# Patient Record
Sex: Female | Born: 1985 | Race: Black or African American | Hispanic: No | Marital: Single | Smoking: Former smoker
Health system: Southern US, Community
[De-identification: ages and names within clinical notes are randomized; demographics above are authoritative.]

## PROBLEM LIST (undated history)

## (undated) ENCOUNTER — Inpatient Hospital Stay (HOSPITAL_COMMUNITY): Payer: Self-pay

## (undated) DIAGNOSIS — Z9889 Other specified postprocedural states: Secondary | ICD-10-CM

## (undated) DIAGNOSIS — D649 Anemia, unspecified: Secondary | ICD-10-CM

## (undated) DIAGNOSIS — K469 Unspecified abdominal hernia without obstruction or gangrene: Secondary | ICD-10-CM

## (undated) HISTORY — PX: BREAST LUMPECTOMY: SHX2

## (undated) HISTORY — DX: Other specified postprocedural states: Z98.890

## (undated) HISTORY — DX: Unspecified abdominal hernia without obstruction or gangrene: K46.9

## (undated) HISTORY — PX: HERNIA REPAIR: SHX51

---

## 2004-01-27 ENCOUNTER — Encounter: Admission: RE | Admit: 2004-01-27 | Discharge: 2004-01-27 | Payer: Self-pay | Admitting: Obstetrics & Gynecology

## 2005-04-21 ENCOUNTER — Ambulatory Visit (HOSPITAL_COMMUNITY): Admission: RE | Admit: 2005-04-21 | Discharge: 2005-04-21 | Payer: Self-pay | Admitting: Urology

## 2005-12-21 ENCOUNTER — Emergency Department (HOSPITAL_COMMUNITY): Admission: EM | Admit: 2005-12-21 | Discharge: 2005-12-22 | Payer: Self-pay | Admitting: Emergency Medicine

## 2006-07-05 ENCOUNTER — Emergency Department (HOSPITAL_COMMUNITY): Admission: EM | Admit: 2006-07-05 | Discharge: 2006-07-06 | Payer: Self-pay | Admitting: Emergency Medicine

## 2007-04-11 ENCOUNTER — Emergency Department (HOSPITAL_COMMUNITY): Admission: EM | Admit: 2007-04-11 | Discharge: 2007-04-11 | Payer: Self-pay | Admitting: Emergency Medicine

## 2008-03-20 ENCOUNTER — Emergency Department (HOSPITAL_COMMUNITY): Admission: EM | Admit: 2008-03-20 | Discharge: 2008-03-20 | Payer: Self-pay | Admitting: Emergency Medicine

## 2008-07-29 ENCOUNTER — Emergency Department (HOSPITAL_COMMUNITY): Admission: EM | Admit: 2008-07-29 | Discharge: 2008-07-29 | Payer: Self-pay | Admitting: Emergency Medicine

## 2013-04-02 ENCOUNTER — Encounter (HOSPITAL_COMMUNITY): Payer: Self-pay | Admitting: Physical Medicine and Rehabilitation

## 2013-04-02 ENCOUNTER — Emergency Department (HOSPITAL_COMMUNITY)
Admission: EM | Admit: 2013-04-02 | Discharge: 2013-04-02 | Disposition: A | Discharge status: Home / Self Care | Payer: 59 | Attending: Emergency Medicine | Admitting: Emergency Medicine

## 2013-04-02 DIAGNOSIS — K047 Periapical abscess without sinus: Secondary | ICD-10-CM

## 2013-04-02 DIAGNOSIS — K089 Disorder of teeth and supporting structures, unspecified: Secondary | ICD-10-CM | POA: Insufficient documentation

## 2013-04-02 DIAGNOSIS — K044 Acute apical periodontitis of pulpal origin: Secondary | ICD-10-CM | POA: Insufficient documentation

## 2013-04-02 DIAGNOSIS — K0889 Other specified disorders of teeth and supporting structures: Secondary | ICD-10-CM

## 2013-04-02 DIAGNOSIS — K029 Dental caries, unspecified: Secondary | ICD-10-CM | POA: Insufficient documentation

## 2013-04-02 DIAGNOSIS — F17200 Nicotine dependence, unspecified, uncomplicated: Secondary | ICD-10-CM | POA: Insufficient documentation

## 2013-04-02 DIAGNOSIS — R110 Nausea: Secondary | ICD-10-CM | POA: Insufficient documentation

## 2013-04-02 DIAGNOSIS — R599 Enlarged lymph nodes, unspecified: Secondary | ICD-10-CM | POA: Insufficient documentation

## 2013-04-02 MED ORDER — CLINDAMYCIN HCL 150 MG PO CAPS: 450.0000 mg | ORAL_CAPSULE | Freq: Three times a day (TID) | ORAL | Status: DC

## 2013-04-02 MED ORDER — HYDROCODONE-ACETAMINOPHEN 5-325 MG PO TABS
2.0000 | ORAL_TABLET | Freq: Once | ORAL | Status: AC
  Administered 2013-04-02: 2 via ORAL
  Filled 2013-04-02: qty 2

## 2013-04-02 MED ORDER — HYDROCODONE-ACETAMINOPHEN 5-325 MG PO TABS: 1.0000 | ORAL_TABLET | ORAL | Status: DC | PRN

## 2013-04-02 NOTE — ED Provider Notes (Signed)
  Medical screening examination/treatment/procedure(s) were performed by non-physician practitioner and as supervising physician I was immediately available for consultation/collaboration.  On my exam the patient was in no distress. She did have TTP about the lower R jaw.  There is no E/O airway compromise, nor systemic distress.    Gerhard Munch, MD 04/02/13 2358

## 2013-04-02 NOTE — ED Notes (Signed)
Pt presents to department for evaluation of R lower molar pain and facial swelling. Ongoing for several days. 10/10 pain at the time. Pt is alert and oriented x4.

## 2013-04-02 NOTE — ED Provider Notes (Signed)
History    This chart was scribed for non-physician practitioner, Dierdre Forth PA-C working with Gerhard Munch, MD by Donne Anon, ED Scribe. This patient was seen in room TR10C/TR10C and the patient's care was started at 1506.   CSN: [id number]  Arrival date & time 04/02/13  1332   First MD Initiated Contact with Patient 04/02/13 1506      Chief Complaint  Patient presents with  . Dental Pain     The history is provided by the patient and medical records. No language interpreter was used.   HPI Comments: Deborah Sawyer is a 27 y.o. female who presents to the Emergency Department complaining of 1.5 days of gradual onset, gradually worsening, waxing and waning, severe right lower dental pain that radiates to her jaw and neck associated facial swelling that began last night, nausea and lightheadedness. She states she had a filling on the lower right side of her mouth 6 years ago, and has been unable to chew hard or sticky foods on that side of her mouth since. She states she chewed gum on the right side of her mouth 2 days ago, which may have prompted the most recent episode. She reports that she has has similar toothaches in the past, but they typically resolve on their own. She has tried ibuprofen and Tylenol with little relief. She denies fever, vomiting, diarrhea, ear pain, or any other pain. She reports she is otherwise healthy.   Dr. Tristan Schroeder is her dentist.   No past medical history on file.  No past surgical history on file.  No family history on file.  History  Substance Use Topics  . Smoking status: Current Every Day Smoker    Types: Cigarettes  . Smokeless tobacco: Not on file  . Alcohol Use: Yes     Review of Systems  Constitutional: Negative for fever, diaphoresis, appetite change, fatigue and unexpected weight change.  HENT: Positive for dental problem. Negative for mouth sores and neck stiffness.   Eyes: Negative for visual disturbance.  Respiratory:  Negative for cough, chest tightness, shortness of breath and wheezing.   Cardiovascular: Negative for chest pain.  Gastrointestinal: Positive for nausea. Negative for vomiting, abdominal pain, diarrhea and constipation.  Endocrine: Negative for polydipsia, polyphagia and polyuria.  Genitourinary: Negative for dysuria, urgency, frequency and hematuria.  Musculoskeletal: Negative for back pain.  Skin: Negative for rash.  Allergic/Immunologic: Negative for immunocompromised state.  Neurological: Negative for syncope, light-headedness and headaches.  Hematological: Does not bruise/bleed easily.  Psychiatric/Behavioral: Negative for sleep disturbance. The patient is not nervous/anxious.   All other systems reviewed and are negative.    Allergies  Review of patient's allergies indicates no known allergies.  Home Medications   Current Outpatient Rx  Name  Route  Sig  Dispense  Refill  . acetaminophen (TYLENOL) 500 MG tablet   Oral   Take 1,000 mg by mouth once as needed for pain.         Marland Kitchen ibuprofen (ADVIL,MOTRIN) 200 MG tablet   Oral   Take 400 mg by mouth once as needed for pain.         . clindamycin (CLEOCIN) 150 MG capsule   Oral   Take 3 capsules (450 mg total) by mouth 3 (three) times daily.   90 capsule   0   . HYDROcodone-acetaminophen (NORCO/VICODIN) 5-325 MG per tablet   Oral   Take 1-2 tablets by mouth every 4 (four) hours as needed for pain.   21 tablet  0     BP 138/88  Pulse 72  Temp(Src) 98.8 F (37.1 C) (Oral)  Resp 18  SpO2 100%  Physical Exam  Nursing note and vitals reviewed. Constitutional: She appears well-developed and well-nourished.  HENT:  Head: Normocephalic.    Right Ear: Tympanic membrane, external ear and ear canal normal.  Left Ear: Tympanic membrane, external ear and ear canal normal.  Nose: Nose normal. Right sinus exhibits no maxillary sinus tenderness and no frontal sinus tenderness. Left sinus exhibits no maxillary sinus  tenderness and no frontal sinus tenderness.  Mouth/Throat: Uvula is midline, oropharynx is clear and moist and mucous membranes are normal. No oral lesions. Abnormal dentition. Dental caries present. No edematous or lacerations. No oropharyngeal exudate, posterior oropharyngeal edema, posterior oropharyngeal erythema or tonsillar abscesses.  Visible swelling of the right side of her jaw. All teeth on the right lower are filled. Tooth 29 with a crack in it. No palpable abscess. No area of fluctuance. Mild induration of the tissue of the right cheek but no visible or palpable apical abscess of any of the teeth. Submandibular lymphadenopathy. No cervical lymphadenopathy.  Eyes: Conjunctivae are normal. Pupils are equal, round, and reactive to light. Right eye exhibits no discharge. Left eye exhibits no discharge.  Neck: Normal range of motion. Neck supple.  Cardiovascular: Normal rate, regular rhythm, normal heart sounds and intact distal pulses.   No murmur heard. Pulmonary/Chest: Effort normal and breath sounds normal. No respiratory distress. She has no wheezes.  Abdominal: Soft. Bowel sounds are normal. She exhibits no distension. There is no tenderness.  Lymphadenopathy:       Head (right side): Submandibular and tonsillar adenopathy present. No submental, no preauricular, no posterior auricular and no occipital adenopathy present.       Head (left side): No submental, no submandibular, no tonsillar, no preauricular, no posterior auricular and no occipital adenopathy present.    She has no cervical adenopathy.       Right cervical: No superficial cervical, no deep cervical and no posterior cervical adenopathy present.      Left cervical: No superficial cervical, no deep cervical and no posterior cervical adenopathy present.  Neurological: She is alert.  Skin: Skin is warm and dry.  Psychiatric: She has a normal mood and affect.    ED Course  Procedures (including critical care  time) DIAGNOSTIC STUDIES: Oxygen Saturation is 100% on RA, normal by my interpretation.    COORDINATION OF CARE: 3:21 PM Discussed treatment plan which includes consulting with Dr. Jeraldine Loots with pt at bedside and pt agreed to plan.   3:31 PM Case discussed with Dr. Jeraldine Loots. He evaluated the pt and recommended antibiotics, pain medication and dental referral.    Labs Reviewed - No data to display No results found.   1. Pain, dental   2. Dental infection       MDM  Leda Gauze presents with dental pain and concern for infection on PE.  Patient with toothache.  No gross abscess amenable to I&D.  Exam unconcerning for Ludwig's angina or spread of infection.  Will treat with penicillin and pain medicine.  Urged patient to follow-up with dentist.  I have also discussed reasons to return immediately to the ER.  Patient expresses understanding and agrees with plan.    I personally performed the services described in this documentation, which was scribed in my presence. The recorded information has been reviewed and is accurate.    Dahlia Client Lovina Zuver, PA-C 04/02/13 1545

## 2017-07-18 ENCOUNTER — Ambulatory Visit (INDEPENDENT_AMBULATORY_CARE_PROVIDER_SITE_OTHER): Payer: Medicaid Other | Admitting: General Practice

## 2017-07-18 ENCOUNTER — Encounter: Payer: Self-pay | Admitting: General Practice

## 2017-07-18 DIAGNOSIS — Z3201 Encounter for pregnancy test, result positive: Secondary | ICD-10-CM

## 2017-07-18 LAB — POCT PREGNANCY, URINE: PREG TEST UR: POSITIVE — AB

## 2017-07-18 NOTE — Progress Notes (Signed)
Agree with nursing staff's documentation of this patient's clinic encounter.  Madelaine Whipple, PA-C    

## 2017-07-18 NOTE — Progress Notes (Signed)
Patient here for UPT today. UPT +. Patient reports first positive home test 07/10/17. LMP 05/26/17 EDD 03/02/18 [redacted]w[redacted]d. Patient denies taking any medications, only PNV. Encouraged patient to start prenatal care around 12 weeks and pregnancy verification letter given. Patient had no questions

## 2017-08-16 ENCOUNTER — Ambulatory Visit (INDEPENDENT_AMBULATORY_CARE_PROVIDER_SITE_OTHER): Payer: Medicaid Other | Admitting: Student

## 2017-08-16 ENCOUNTER — Encounter: Payer: Self-pay | Admitting: Student

## 2017-08-16 ENCOUNTER — Other Ambulatory Visit (HOSPITAL_COMMUNITY)
Admission: RE | Admit: 2017-08-16 | Discharge: 2017-08-16 | Disposition: A | Discharge status: Home / Self Care | Payer: Medicaid Other | Source: Ambulatory Visit | Attending: Student | Admitting: Student

## 2017-08-16 VITALS — BP 113/62 | HR 76 | Ht 62.0 in | Wt 154.1 lb

## 2017-08-16 DIAGNOSIS — Z1389 Encounter for screening for other disorder: Secondary | ICD-10-CM | POA: Diagnosis not present

## 2017-08-16 DIAGNOSIS — Z3480 Encounter for supervision of other normal pregnancy, unspecified trimester: Secondary | ICD-10-CM | POA: Diagnosis not present

## 2017-08-16 DIAGNOSIS — Z113 Encounter for screening for infections with a predominantly sexual mode of transmission: Secondary | ICD-10-CM

## 2017-08-16 DIAGNOSIS — Z331 Pregnant state, incidental: Secondary | ICD-10-CM

## 2017-08-16 DIAGNOSIS — Z3481 Encounter for supervision of other normal pregnancy, first trimester: Secondary | ICD-10-CM | POA: Diagnosis not present

## 2017-08-16 LAB — POCT URINALYSIS DIP (DEVICE)
BILIRUBIN URINE: NEGATIVE
GLUCOSE, UA: NEGATIVE mg/dL
Hgb urine dipstick: NEGATIVE
KETONES UR: NEGATIVE mg/dL
Nitrite: NEGATIVE
PROTEIN: NEGATIVE mg/dL
SPECIFIC GRAVITY, URINE: 1.01 (ref 1.005–1.030)
Urobilinogen, UA: 0.2 mg/dL (ref 0.0–1.0)
pH: 5.5 (ref 5.0–8.0)

## 2017-08-16 NOTE — Patient Instructions (Signed)

## 2017-08-16 NOTE — Progress Notes (Signed)
  Subjective:    Deborah Sawyer is being seen today for her first obstetrical visit.  This is a planned pregnancy. She is at 55106w5d gestation. Her obstetrical history is significant for nothing. . Relationship with FOB: significant other, living together. Patient does intend to breast feed. Pregnancy history fully reviewed.  Patient reports no complaints.  Review of Systems:   Review of Systems  Constitutional: Negative.   HENT: Negative.   Respiratory: Negative.   Cardiovascular: Negative.   Gastrointestinal: Negative.   Genitourinary: Negative.   Musculoskeletal: Negative.   Neurological: Negative.     Objective:     BP 113/62   Pulse 76   Ht 5\' 2"  (1.575 m)   Wt 154 lb 1.6 oz (69.9 kg)   LMP 05/26/2017 (Exact Date)   BMI 28.19 kg/m  Physical Exam  Constitutional: She is oriented to person, place, and time. She appears well-developed and well-nourished.  HENT:  Head: Normocephalic.  Eyes: Pupils are equal, round, and reactive to light.  Neck: Normal range of motion.  Respiratory: Effort normal and breath sounds normal.  GI: Soft. Bowel sounds are normal.  Genitourinary:  Genitourinary Comments: NEFG; white mucousy discharge in the vagina, cervix is pink and non-tender. No suprapubic or adnexal tenderness.   Musculoskeletal: Normal range of motion.  Neurological: She is alert and oriented to person, place, and time.  Skin: Skin is warm and dry.    Exam    Assessment:    Pregnancy: G2P1001 Patient Active Problem List   Diagnosis Date Noted  . Supervision of other normal pregnancy, antepartum 08/16/2017       Plan:     Initial labs drawn. Prenatal vitamins. Problem list reviewed and updated. AFP3 discussed: declined. Role of ultrasound in pregnancy discussed; fetal survey: order at next visit. . Amniocentesis discussed: not indicated. Follow up in 4 weeks. 50% of 30 min visit spent on counseling and coordination of care.    Reviewed how our practice  works; all questions answered.   Will enroll in [employer].  Charlesetta GaribaldiKathryn Lorraine Kooistra CNM 08/16/2017

## 2017-08-17 LAB — CYTOLOGY - PAP
Chlamydia: NEGATIVE
Diagnosis: NEGATIVE
HPV: NOT DETECTED
NEISSERIA GONORRHEA: NEGATIVE

## 2017-08-18 LAB — CULTURE, OB URINE

## 2017-08-18 LAB — URINE CULTURE, OB REFLEX

## 2017-08-20 ENCOUNTER — Other Ambulatory Visit: Payer: Self-pay | Admitting: Student

## 2017-08-20 DIAGNOSIS — A599 Trichomoniasis, unspecified: Secondary | ICD-10-CM | POA: Insufficient documentation

## 2017-08-20 LAB — HEMOGLOBINOPATHY EVALUATION
HGB C: 0 %
HGB S: 0 %
HGB VARIANT: 0 %
Hemoglobin A2 Quantitation: 2.3 % (ref 1.8–3.2)
Hemoglobin F Quantitation: 0 % (ref 0.0–2.0)
Hgb A: 97.7 % (ref 96.4–98.8)

## 2017-08-20 LAB — OBSTETRIC PANEL, INCLUDING HIV
Antibody Screen: NEGATIVE
BASOS ABS: 0 10*3/uL (ref 0.0–0.2)
Basos: 0 %
EOS (ABSOLUTE): 0.3 10*3/uL (ref 0.0–0.4)
Eos: 3 %
HEP B S AG: NEGATIVE
HIV Screen 4th Generation wRfx: NONREACTIVE
Hematocrit: 34.2 % (ref 34.0–46.6)
Hemoglobin: 11 g/dL — ABNORMAL LOW (ref 11.1–15.9)
IMMATURE GRANS (ABS): 0 10*3/uL (ref 0.0–0.1)
IMMATURE GRANULOCYTES: 0 %
LYMPHS: 20 %
Lymphocytes Absolute: 2.3 10*3/uL (ref 0.7–3.1)
MCH: 28.9 pg (ref 26.6–33.0)
MCHC: 32.2 g/dL (ref 31.5–35.7)
MCV: 90 fL (ref 79–97)
MONOCYTES: 6 %
Monocytes Absolute: 0.7 10*3/uL (ref 0.1–0.9)
NEUTROS ABS: 8.1 10*3/uL — AB (ref 1.4–7.0)
NEUTROS PCT: 71 %
PLATELETS: 263 10*3/uL (ref 150–379)
RBC: 3.81 x10E6/uL (ref 3.77–5.28)
RDW: 14 % (ref 12.3–15.4)
RPR: NONREACTIVE
RUBELLA: 6.99 {index} (ref >0.99)
Rh Factor: POSITIVE
WBC: 11.4 10*3/uL — ABNORMAL HIGH (ref 3.4–10.8)

## 2017-08-20 LAB — HEMOGLOBIN A1C
ESTIMATED AVERAGE GLUCOSE: 97 mg/dL
HEMOGLOBIN A1C: 5 % (ref 4.8–5.6)

## 2017-08-20 MED ORDER — METRONIDAZOLE 500 MG PO TABS: 2000.0000 mg | ORAL_TABLET | Freq: Once | ORAL | 0 refills | Status: AC

## 2017-08-20 MED ORDER — METRONIDAZOLE 500 MG PO TABS: 2000.0000 mg | ORAL_TABLET | Freq: Once | ORAL | 0 refills | Status: DC

## 2017-08-20 MED ORDER — METRONIDAZOLE 500 MG PO TABS: 500.0000 mg | ORAL_TABLET | Freq: Two times a day (BID) | ORAL | 0 refills | Status: DC

## 2017-08-20 MED ORDER — METRONIDAZOLE 500 MG PO TABS: 2000.0000 mg | ORAL_TABLET | Freq: Two times a day (BID) | ORAL | 0 refills | Status: DC

## 2017-08-21 NOTE — Addendum Note (Signed)
Addended by: Lorelle GibbsWILSON, CHIQUITA L on: 08/21/2017 08:11 AM   Modules accepted: Orders

## 2017-08-21 NOTE — Addendum Note (Signed)
Addended by: Mikey BussingWILSON, Najae Rathert L on: 08/21/2017 07:39 AM   Modules accepted: Orders

## 2017-08-24 LAB — CYSTIC FIBROSIS MUTATION 97: GENE DIS ANAL CARRIER INTERP BLD/T-IMP: NOT DETECTED

## 2017-08-30 ENCOUNTER — Ambulatory Visit (HOSPITAL_COMMUNITY)
Admission: RE | Admit: 2017-08-30 | Discharge: 2017-08-30 | Disposition: A | Discharge status: Home / Self Care | Payer: Medicaid Other | Source: Ambulatory Visit | Attending: Student | Admitting: Student

## 2017-08-30 DIAGNOSIS — Z3480 Encounter for supervision of other normal pregnancy, unspecified trimester: Secondary | ICD-10-CM | POA: Insufficient documentation

## 2017-08-30 DIAGNOSIS — Z3A13 13 weeks gestation of pregnancy: Secondary | ICD-10-CM | POA: Diagnosis not present

## 2017-09-20 ENCOUNTER — Ambulatory Visit (INDEPENDENT_AMBULATORY_CARE_PROVIDER_SITE_OTHER): Payer: Medicaid Other | Admitting: Student

## 2017-09-20 ENCOUNTER — Other Ambulatory Visit (HOSPITAL_COMMUNITY)
Admission: RE | Admit: 2017-09-20 | Discharge: 2017-09-20 | Disposition: A | Discharge status: Home / Self Care | Payer: Medicaid Other | Source: Ambulatory Visit | Attending: Student | Admitting: Student

## 2017-09-20 VITALS — BP 121/59 | HR 81 | Wt 155.8 lb

## 2017-09-20 DIAGNOSIS — A599 Trichomoniasis, unspecified: Secondary | ICD-10-CM | POA: Insufficient documentation

## 2017-09-20 DIAGNOSIS — Z3480 Encounter for supervision of other normal pregnancy, unspecified trimester: Secondary | ICD-10-CM

## 2017-09-20 DIAGNOSIS — Z3482 Encounter for supervision of other normal pregnancy, second trimester: Secondary | ICD-10-CM

## 2017-09-20 NOTE — Patient Instructions (Signed)
Second Trimester of Pregnancy The second trimester is from week 13 through week 28, month 4 through 6. This is often the time in pregnancy that you feel your best. Often times, morning sickness has lessened or quit. You may have more energy, and you may get hungry more often. Your unborn baby (fetus) is growing rapidly. At the end of the sixth month, he or she is about 9 inches long and weighs about 1 pounds. You will likely feel the baby move (quickening) between 18 and 20 weeks of pregnancy. Follow these instructions at home:  Avoid all smoking, herbs, and alcohol. Avoid drugs not approved by your doctor.  Do not use any tobacco products, including cigarettes, chewing tobacco, and electronic cigarettes. If you need help quitting, ask your doctor. You may get counseling or other support to help you quit.  Only take medicine as told by your doctor. Some medicines are safe and some are not during pregnancy.  Exercise only as told by your doctor. Stop exercising if you start having cramps.  Eat regular, healthy meals.  Wear a good support bra if your breasts are tender.  Do not use hot tubs, steam rooms, or saunas.  Wear your seat belt when driving.  Avoid raw meat, uncooked cheese, and liter boxes and soil used by cats.  Take your prenatal vitamins.  Take 1500-2000 milligrams of calcium daily starting at the 20th week of pregnancy until you deliver your baby.  Try taking medicine that helps you poop (stool softener) as needed, and if your doctor approves. Eat more fiber by eating fresh fruit, vegetables, and whole grains. Drink enough fluids to keep your pee (urine) clear or pale yellow.  Take warm water baths (sitz baths) to soothe pain or discomfort caused by hemorrhoids. Use hemorrhoid cream if your doctor approves.  If you have puffy, bulging veins (varicose veins), wear support hose. Raise (elevate) your feet for 15 minutes, 3-4 times a day. Limit salt in your diet.  Avoid heavy  lifting, wear low heals, and sit up straight.  Rest with your legs raised if you have leg cramps or low back pain.  Visit your dentist if you have not gone during your pregnancy. Use a soft toothbrush to brush your teeth. Be gentle when you floss.  You can have sex (intercourse) unless your doctor tells you not to.  Go to your doctor visits. Get help if:  You feel dizzy.  You have mild cramps or pressure in your lower belly (abdomen).  You have a nagging pain in your belly area.  You continue to feel sick to your stomach (nauseous), throw up (vomit), or have watery poop (diarrhea).  You have bad smelling fluid coming from your vagina.  You have pain with peeing (urination). Get help right away if:  You have a fever.  You are leaking fluid from your vagina.  You have spotting or bleeding from your vagina.  You have severe belly cramping or pain.  You lose or gain weight rapidly.  You have trouble catching your breath and have chest pain.  You notice sudden or extreme puffiness (swelling) of your face, hands, ankles, feet, or legs.  You have not felt the baby move in over an hour.  You have severe headaches that do not go away with medicine.  You have vision changes. This information is not intended to replace advice given to you by your health care provider. Make sure you discuss any questions you have with your health care   provider. Document Released: 01/03/2010 Document Revised: 03/16/2016 Document Reviewed: 12/10/2012 Elsevier Interactive Patient Education  2017 Elsevier Inc.  

## 2017-09-20 NOTE — Progress Notes (Signed)
   PRENATAL VISIT NOTE  Subjective:  Deborah Sawyer is a 31 y.o. G2P1001 at 3290w5d being seen today for ongoing prenatal care.  She is currently monitored for the following issues for this low-risk pregnancy and has Supervision of other normal pregnancy, antepartum and Trichomoniasis on their problem list.  Patient reports no complaints.  Contractions: Not present. Vag. Bleeding: None.  Movement: Absent. Denies leaking of fluid.   The following portions of the patient's history were reviewed and updated as appropriate: allergies, current medications, past family history, past medical history, past social history, past surgical history and problem list. Problem list updated.  Objective:   Vitals:   09/20/17 0936  BP: (!) 121/59  Pulse: 81  Weight: 155 lb 12.8 oz (70.7 kg)    Fetal Status: Fetal Heart Rate (bpm): 146   Movement: Absent     General:  Alert, oriented and cooperative. Patient is in no acute distress.  Skin: Skin is warm and dry. No rash noted.   Cardiovascular: Normal heart rate noted  Respiratory: Normal respiratory effort, no problems with respiration noted  Abdomen: Soft, gravid, appropriate for gestational age.  Pain/Pressure: Present     Pelvic: Cervical exam deferred        Extremities: Normal range of motion.  Edema: Trace  Mental Status:  Normal mood and affect. Normal behavior. Normal judgment and thought content.   Assessment and Plan:  Pregnancy: G2P1001 at 5190w5d  1. Supervision of other normal pregnancy, antepartum Doing well; no complaints.  - AFP TETRA - US MFM OB COMP + 14 WK; Future - Cervicovaginal ancillary only  2. Trichomoniasis TOC done today - Cervicovaginal ancillary only  Preterm labor symptoms and general obstetric precautions including but not limited to vaginal bleeding, contractions, leaking of fluid and fetal movement were reviewed in detail with the patient. Please refer to After Visit Summary for other counseling recommendations.    Return in about 4 weeks (around 10/18/2017).   Marylene LandKathryn Lorraine Kooistra, CNM

## 2017-09-21 LAB — CERVICOVAGINAL ANCILLARY ONLY: Trichomonas: NEGATIVE

## 2017-09-23 LAB — AFP TETRA
DIA MOM VALUE: 1.85
DIA VALUE (EIA): 312.83 pg/mL
DSR (By Age)    1 IN: 405
DSR (SECOND TRIMESTER) 1 IN: 1198
GESTATIONAL AGE AFP: 16 wk
MATERNAL AGE AT EDD: 33.5 a
MSAFP Mom: 1.1
MSAFP: 37.7 ng/mL
MSHCG Mom: 0.76
MSHCG: 29545 m[IU]/mL
OSB RISK: 10000
TEST RESULTS AFP: NEGATIVE
UE3 MOM: 1.14
WEIGHT: 155 [lb_av]
uE3 Value: 0.9 ng/mL

## 2017-10-02 ENCOUNTER — Encounter (HOSPITAL_COMMUNITY): Payer: Self-pay | Admitting: Student

## 2017-10-09 ENCOUNTER — Ambulatory Visit (HOSPITAL_COMMUNITY)
Admission: RE | Admit: 2017-10-09 | Discharge: 2017-10-09 | Disposition: A | Discharge status: Home / Self Care | Payer: Medicaid Other | Source: Ambulatory Visit | Attending: Student | Admitting: Student

## 2017-10-09 DIAGNOSIS — Z3A19 19 weeks gestation of pregnancy: Secondary | ICD-10-CM | POA: Diagnosis not present

## 2017-10-09 DIAGNOSIS — Z3480 Encounter for supervision of other normal pregnancy, unspecified trimester: Secondary | ICD-10-CM | POA: Diagnosis present

## 2017-10-09 DIAGNOSIS — Z3482 Encounter for supervision of other normal pregnancy, second trimester: Secondary | ICD-10-CM | POA: Diagnosis not present

## 2017-10-09 DIAGNOSIS — Z3689 Encounter for other specified antenatal screening: Secondary | ICD-10-CM | POA: Diagnosis not present

## 2017-10-18 ENCOUNTER — Ambulatory Visit (INDEPENDENT_AMBULATORY_CARE_PROVIDER_SITE_OTHER): Payer: Medicaid Other | Admitting: Student

## 2017-10-18 DIAGNOSIS — A599 Trichomoniasis, unspecified: Secondary | ICD-10-CM

## 2017-10-18 DIAGNOSIS — Z3482 Encounter for supervision of other normal pregnancy, second trimester: Secondary | ICD-10-CM

## 2017-10-18 DIAGNOSIS — Z3480 Encounter for supervision of other normal pregnancy, unspecified trimester: Secondary | ICD-10-CM

## 2017-10-18 NOTE — Patient Instructions (Signed)
AREA PEDIATRIC/FAMILY PRACTICE PHYSICIANS  [facility] [address], [address] [place], [place]  [address] Phone - [phone]   Fax - [phone]  [facility] [address] [place], [place] [address] Phone - [phone]   Fax - [phone]  [facility] [address] [place], [place]  [address] Phone - [phone]   Fax - [phone]  [facility] [address] [place], [place]  [address] Phone - [phone]   Fax - [phone]  [facility] [address] [place], [place]  [address] Phone - [phone]   Fax - [phone]  [facility] [address] [place], [place]  [address] Phone - [phone]   Fax - [phone]  [facility] [address] [place], [place]  [address] Phone - [phone]   Fax - [phone]  [facility] [address] [place], [place]  [address] Phone - [phone]   Fax - [phone]  [facility] [address] [place], [place]  [address] Phone - [phone]   Fax - [phone] [facility] [address] [place], [place]  [address] Phone - [phone]   Fax - [phone]  [facility] [address] [place], [place]  [address] Phone - [phone]   Fax - [phone]  [facility] [address] [place], [place]  [address] Phone - [phone]   Fax - [phone]  [facility] [address] [place], [place]  [address] Phone - [phone]   Fax - [phone]  [facility] [address] [place], [place]  [address] Phone - [phone]  [facility] [address] [place], [place]  [address] Phone - [phone]   Fax - [phone]  [facility] [address] [place], [place]  [address] Phone - [phone]   Fax - [phone]  [facility] [address] [place], [place]  [address] Phone - [phone]   Fax - [phone]  [facility] [address] [place], [place]  [address] Phone - [phone]   Fax - [phone]   [facility] [address] [place], [place]  [address] Phone - [phone]   Fax - [phone]  [facility] [address] [place], [place]  [address] Phone - [phone]   Fax - [phone]  [facility] [address] [place], [place]  [address] Phone - [phone]   Fax - [phone]  [facility] [address] [place], [place]  [address] Phone - [phone]   Fax - [phone]  [facility] [address] , [place]  [address] Phone - [phone]   Fax - [phone] [facility] [address] [place], [place]  [address] Phone - [phone]   Fax - [phone]  [facility] [address] [place], [place]  [address] Phone - [phone]   Fax - [phone]  [facility] [address] [place], [place]  [address] Phone - [phone]   Fax - [phone]  [facility] Charlene Flemming MD [address] [place] [address] Phone [phone]  Fax [phone]   

## 2017-10-18 NOTE — Progress Notes (Signed)
   PRENATAL VISIT NOTE  Subjective:  Deborah Sawyer is a 31 y.o. G2P1001 at 760w5d being seen today for ongoing prenatal care.  She is currently monitored for the following issues for this low-risk pregnancy and has Supervision of other normal pregnancy, antepartum and Trichomoniasis on their problem list.  Patient reports no complaints.  Contractions: Not present. Vag. Bleeding: None.  Movement: Present. Denies leaking of fluid.   The following portions of the patient's history were reviewed and updated as appropriate: allergies, current medications, past family history, past medical history, past social history, past surgical history and problem list. Problem list updated.  Objective:   Vitals:   10/18/17 1535  BP: 111/73  Pulse: 80  Weight: 165 lb 1.6 oz (74.9 kg)    Fetal Status: Fetal Heart Rate (bpm): 141   Movement: Present     General:  Alert, oriented and cooperative. Patient is in no acute distress.  Skin: Skin is warm and dry. No rash noted.   Cardiovascular: Normal heart rate noted  Respiratory: Normal respiratory effort, no problems with respiration noted  Abdomen: Soft, gravid, appropriate for gestational age.  Pain/Pressure: Present     Pelvic: Cervical exam deferred        Extremities: Normal range of motion.  Edema: Trace  Mental Status:  Normal mood and affect. Normal behavior. Normal judgment and thought content.   Assessment and Plan:  Pregnancy: G2P1001 at 7060w5d  1. Trichomoniasis Denies signs and symptoms. TOC was negative in November,.   2. Supervision of other normal pregnancy, antepartum Doing well return in 4 weeks.  Peds list given  Preterm labor symptoms and general obstetric precautions including but not limited to vaginal bleeding, contractions, leaking of fluid and fetal movement were reviewed in detail with the patient. Please refer to After Visit Summary for other counseling recommendations.  Return in about 4 weeks (around 11/15/2017), or  ROB.   Marylene LandKathryn Lorraine Kooistra, CNM

## 2017-10-23 NOTE — L&D Delivery Note (Signed)
Patient: Deborah Sawyer MRN: [id number]  GBS status: negative  Patient is a 32 y.o. now G2P2002 s/p NSVD at [redacted]w[redacted]d, who was admitted for PROM 15h 45m prior to delivery and latent labor. Labor augmented with IV Pitocin, and progressed well. Pushed for 4 minutes.  Delivery Note At 7:18 PM a viable female was delivered via Vaginal, Spontaneous (Presentation: vertex; LOA).  APGAR: 9, 9; weight pending  Placenta status: intact, sent to L&D.  Cord: 3-vessel  Anesthesia:  None; local lidocaine for perineal repair Episiotomy: None Lacerations: 1st degree perineal (repaired), left periurethral (hemostatic) Suture Repair: 3.0 Monocryl  Est. Blood Loss (mL): 200  Mom to postpartum.  Baby to Couplet care / Skin to Skin.  Raynelle Fanning P. [place], MD OB Fellow 03/05/18, 7:51 PM

## 2017-11-15 ENCOUNTER — Ambulatory Visit (INDEPENDENT_AMBULATORY_CARE_PROVIDER_SITE_OTHER): Payer: Medicaid Other | Admitting: Student

## 2017-11-15 VITALS — BP 111/53 | HR 89 | Wt 169.6 lb

## 2017-11-15 DIAGNOSIS — Z3482 Encounter for supervision of other normal pregnancy, second trimester: Secondary | ICD-10-CM

## 2017-11-15 DIAGNOSIS — Z3480 Encounter for supervision of other normal pregnancy, unspecified trimester: Secondary | ICD-10-CM

## 2017-11-15 NOTE — Patient Instructions (Signed)

## 2017-11-15 NOTE — Progress Notes (Signed)
   PRENATAL VISIT NOTE  Subjective:  Deborah Sawyer is a 32 y.o. G2P1001 at 4353w5d being seen today for ongoing prenatal care.  She is currently monitored for the following issues for this low-risk pregnancy and has Supervision of other normal pregnancy, antepartum and Trichomoniasis on their problem list.  Patient reports no complaints.  Contractions: Not present. Vag. Bleeding: None.  Movement: Present. Denies leaking of fluid.   The following portions of the patient's history were reviewed and updated as appropriate: allergies, current medications, past family history, past medical history, past social history, past surgical history and problem list. Problem list updated.  Objective:   Vitals:   11/15/17 1423  BP: (!) 111/53  Pulse: 89  Weight: 169 lb 9.6 oz (76.9 kg)    Fetal Status: Fetal Heart Rate (bpm): 155 Fundal Height: 25 cm Movement: Present     General:  Alert, oriented and cooperative. Patient is in no acute distress.  Skin: Skin is warm and dry. No rash noted.   Cardiovascular: Normal heart rate noted  Respiratory: Normal respiratory effort, no problems with respiration noted  Abdomen: Soft, gravid, appropriate for gestational age.  Pain/Pressure: Present     Pelvic: Cervical exam deferred        Extremities: Normal range of motion.  Edema: Trace  Mental Status:  Normal mood and affect. Normal behavior. Normal judgment and thought content.   Assessment and Plan:  Pregnancy: G2P1001 at 6253w5d  1. Supervision of other normal pregnancy, antepartum Doing well; recommended compression stockings for edema.  -Plan for GTT next time.   Preterm labor symptoms and general obstetric precautions including but not limited to vaginal bleeding, contractions, leaking of fluid and fetal movement were reviewed in detail with the patient. Please refer to After Visit Summary for other counseling recommendations.  Return in about 4 weeks (around 12/13/2017), or LROB and 2 hour  gtt.   Marylene LandKathryn Lorraine Kooistra, CNM

## 2017-11-26 ENCOUNTER — Emergency Department (HOSPITAL_COMMUNITY)
Admission: EM | Admit: 2017-11-26 | Discharge: 2017-11-26 | Disposition: A | Discharge status: Home / Self Care | Payer: Medicaid Other | Attending: Emergency Medicine | Admitting: Emergency Medicine

## 2017-11-26 ENCOUNTER — Emergency Department (HOSPITAL_COMMUNITY): Payer: Medicaid Other

## 2017-11-26 ENCOUNTER — Encounter (HOSPITAL_COMMUNITY): Payer: Self-pay

## 2017-11-26 ENCOUNTER — Other Ambulatory Visit: Payer: Self-pay

## 2017-11-26 DIAGNOSIS — O9989 Other specified diseases and conditions complicating pregnancy, childbirth and the puerperium: Secondary | ICD-10-CM | POA: Diagnosis present

## 2017-11-26 DIAGNOSIS — Z3A25 25 weeks gestation of pregnancy: Secondary | ICD-10-CM | POA: Insufficient documentation

## 2017-11-26 DIAGNOSIS — R109 Unspecified abdominal pain: Secondary | ICD-10-CM

## 2017-11-26 DIAGNOSIS — Z87891 Personal history of nicotine dependence: Secondary | ICD-10-CM | POA: Diagnosis not present

## 2017-11-26 LAB — CBC WITH DIFFERENTIAL/PLATELET
BASOS ABS: 0 10*3/uL (ref 0.0–0.1)
Basophils Relative: 0 %
EOS PCT: 1 %
Eosinophils Absolute: 0.1 10*3/uL (ref 0.0–0.7)
HCT: 31.2 % — ABNORMAL LOW (ref 36.0–46.0)
Hemoglobin: 10 g/dL — ABNORMAL LOW (ref 12.0–15.0)
LYMPHS ABS: 2.1 10*3/uL (ref 0.7–4.0)
LYMPHS PCT: 19 %
MCH: 28.9 pg (ref 26.0–34.0)
MCHC: 32.1 g/dL (ref 30.0–36.0)
MCV: 90.2 fL (ref 78.0–100.0)
MONO ABS: 0.7 10*3/uL (ref 0.1–1.0)
Monocytes Relative: 7 %
Neutro Abs: 7.8 10*3/uL — ABNORMAL HIGH (ref 1.7–7.7)
Neutrophils Relative %: 73 %
PLATELETS: 222 10*3/uL (ref 150–400)
RBC: 3.46 MIL/uL — ABNORMAL LOW (ref 3.87–5.11)
RDW: 14.9 % (ref 11.5–15.5)
WBC: 10.7 10*3/uL — ABNORMAL HIGH (ref 4.0–10.5)

## 2017-11-26 LAB — COMPREHENSIVE METABOLIC PANEL
ALT: 19 U/L (ref 14–54)
AST: 23 U/L (ref 15–41)
Albumin: 3 g/dL — ABNORMAL LOW (ref 3.5–5.0)
Alkaline Phosphatase: 48 U/L (ref 38–126)
Anion gap: 9 (ref 5–15)
BUN: 6 mg/dL (ref 6–20)
CHLORIDE: 109 mmol/L (ref 101–111)
CO2: 18 mmol/L — ABNORMAL LOW (ref 22–32)
Calcium: 9.1 mg/dL (ref 8.9–10.3)
Creatinine, Ser: 0.59 mg/dL (ref 0.44–1.00)
Glucose, Bld: 87 mg/dL (ref 65–99)
POTASSIUM: 3.7 mmol/L (ref 3.5–5.1)
Sodium: 136 mmol/L (ref 135–145)
Total Bilirubin: 0.4 mg/dL (ref 0.3–1.2)
Total Protein: 6.2 g/dL — ABNORMAL LOW (ref 6.5–8.1)

## 2017-11-26 LAB — URINALYSIS, ROUTINE W REFLEX MICROSCOPIC
Bilirubin Urine: NEGATIVE
Glucose, UA: NEGATIVE mg/dL
HGB URINE DIPSTICK: NEGATIVE
Ketones, ur: NEGATIVE mg/dL
LEUKOCYTES UA: NEGATIVE
Nitrite: NEGATIVE
PROTEIN: NEGATIVE mg/dL
Specific Gravity, Urine: 1.023 (ref 1.005–1.030)
pH: 7 (ref 5.0–8.0)

## 2017-11-26 LAB — LIPASE, BLOOD: LIPASE: 23 U/L (ref 11–51)

## 2017-11-26 NOTE — Discharge Instructions (Signed)
As discussed, your evaluation today has been largely reassuring.  But, it is important that you monitor your condition carefully, and do not hesitate to return to the ED if you develop new, or concerning changes in your condition. ? ?Otherwise, please follow-up with your physician for appropriate ongoing care. ? ?

## 2017-11-26 NOTE — Progress Notes (Signed)
Spoke with Dr.Anyanwu. Pt is aG2P1 at 26 2/[redacted] weeks gestation here with c/o left sided back pain x 3 weeks that has gotten progressively worse. CBC with diff and CMP are nl. Urinalysis is pending. No vaginal bleeding or leaking of fluid. She reviewed the FHR tracing and says that pt is OB cleared and can come off of the FM. ED MD can call her with the results of the renal and OB Limited ultrasounds at [phone]. ED staff notified.

## 2017-11-26 NOTE — ED Notes (Signed)
Patient Alert and oriented X4. Stable and ambulatory. Patient verbalized understanding of the discharge instructions.  Patient belongings were taken by the patient.  

## 2017-11-26 NOTE — ED Triage Notes (Signed)
Pt states she has back pain, left side. She states she has been having intermittent pain X3 weeks. Pt states she initially thought it was a pulled muscle but pain has gotten progressively worse. Pt is [redacted] weeks pregnant.

## 2017-11-26 NOTE — Progress Notes (Signed)
Pt is a G2P1 at 3326 2/[redacted] weeks gestation here with c/o of back pain on her left side. Says she has had this pain for 3 weeks now, and it has gotten worse. She thinks she may have pulled a muscle. No vaginal bleeding or leaking of fluid. Says she delivered her 1st baby vaginally 13 years ago. She gets her care at [facility].

## 2017-11-26 NOTE — ED Notes (Signed)
Patient transported to Ultrasound 

## 2017-11-26 NOTE — ED Provider Notes (Signed)
Ultrasound unremarkable x2 patient has been cleared by obstetrics, and with reassuring findings, will follow up with her OB team.   Gerhard MunchLockwood, Elston Aldape, MD 11/26/17 1946

## 2017-11-26 NOTE — ED Provider Notes (Signed)
[facility]ENC[facility] Provider Note   CSN: [id number] Arrival date & time: 11/26/17  1323     History   Chief Complaint Chief Complaint  Patient presents with  . Back Pain    HPI Deborah Gauzemmani D Sawyer is a 32 y.o. female.  The history is provided by the patient and medical records. No language interpreter was used.  Flank Pain  This is a new problem. The current episode started more than 1 week ago. The problem occurs constantly. The problem has not changed since onset.Pertinent negatives include no chest pain, no abdominal pain, no headaches and no shortness of breath. Nothing aggravates the symptoms. Nothing relieves the symptoms. She has tried nothing for the symptoms. The treatment provided no relief.    Past Medical History:  Diagnosis Date  . Hernia of abdominal cavity age 22  . Hx of lumpectomy age 32    Patient Active Problem List   Diagnosis Date Noted  . Trichomoniasis 08/20/2017  . Supervision of other normal pregnancy, antepartum 08/16/2017    Past Surgical History:  Procedure Laterality Date  . HERNIA REPAIR      OB History    Gravida Para Term Preterm AB Living   2 1 1     1    SAB TAB Ectopic Multiple Live Births           1       Home Medications    Prior to Admission medications   Medication Sig Start Date End Date Taking? Authorizing Provider  acetaminophen (TYLENOL) 500 MG tablet Take 1,000 mg by mouth once as needed for pain.    [provider]  clindamycin (CLEOCIN) 150 MG capsule Take 3 capsules (450 mg total) by mouth 3 (three) times daily. Patient not taking: Reported on 08/16/2017 04/02/13   Muthersbaugh, Dahlia ClientHannah, PA-C  Prenatal Multivit-Min-Fe-FA (PRENATAL VITAMINS PO) Take by mouth.    [provider]    Family History History reviewed. No pertinent family history.  Social History Social History   Tobacco Use  . Smoking status: Former Smoker    Types: Cigarettes    Last attempt to quit:  06/23/2017    Years since quitting: 0.4  . Smokeless tobacco: Never Used  Substance Use Topics  . Alcohol use: No  . Drug use: No     Allergies   Patient has no known allergies.   Review of Systems Review of Systems  Constitutional: Negative for chills, diaphoresis and fatigue.  HENT: Negative for congestion.   Eyes: Negative for visual disturbance.  Respiratory: Negative for cough, chest tightness, shortness of breath, wheezing and stridor.   Cardiovascular: Negative for chest pain and palpitations.  Gastrointestinal: Negative for abdominal pain, diarrhea, nausea and vomiting.  Genitourinary: Positive for flank pain. Negative for dysuria and frequency.  Musculoskeletal: Negative for back pain, neck pain and neck stiffness.  Skin: Negative for rash and wound.  Neurological: Negative for light-headedness and headaches.  Psychiatric/Behavioral: Negative for agitation.  All other systems reviewed and are negative.    Physical Exam Updated Vital Signs LMP 05/26/2017 (Exact Date)   Physical Exam  Constitutional: She is oriented to person, place, and time. She appears well-developed and well-nourished. No distress.  HENT:  Head: Normocephalic and atraumatic.  Mouth/Throat: Oropharynx is clear and moist. No oropharyngeal exudate.  Eyes: Conjunctivae and EOM are normal. Pupils are equal, round, and reactive to light.  Neck: Normal range of motion.  Cardiovascular: Normal rate.  No murmur heard. Pulmonary/Chest: Effort  normal and breath sounds normal. No respiratory distress. She has no wheezes. She has no rales. She exhibits no tenderness.  Abdominal: Soft. Bowel sounds are normal. She exhibits distension. There is no tenderness. There is no rigidity and no rebound.    Gravid  Musculoskeletal: She exhibits tenderness. She exhibits no edema or deformity.       Thoracic back: She exhibits tenderness and pain. She exhibits no deformity and no laceration.        Back:  Neurological: She is alert and oriented to person, place, and time. No sensory deficit. She exhibits normal muscle tone.  Skin: Capillary refill takes less than 2 seconds. No rash noted. She is not diaphoretic. No erythema.  Psychiatric: She has a normal mood and affect.  Nursing note and vitals reviewed.    ED Treatments / Results  Labs (all labs ordered are listed, but only abnormal results are displayed) Labs Reviewed  CBC WITH DIFFERENTIAL/PLATELET - Abnormal; Notable for the following components:      Result Value   WBC 10.7 (*)    RBC 3.46 (*)    Hemoglobin 10.0 (*)    HCT 31.2 (*)    Neutro Abs 7.8 (*)    All other components within normal limits  COMPREHENSIVE METABOLIC PANEL - Abnormal; Notable for the following components:   CO2 18 (*)    Total Protein 6.2 (*)    Albumin 3.0 (*)    All other components within normal limits  URINALYSIS, ROUTINE W REFLEX MICROSCOPIC - Abnormal; Notable for the following components:   APPearance CLOUDY (*)    All other components within normal limits  URINE CULTURE  LIPASE, BLOOD    EKG  EKG Interpretation None       Radiology US Ob Limited  Result Date: 11/26/2017 CLINICAL DATA:  Pregnant, [redacted] weeks pregnant, LEFT flank pain EXAM: LIMITED OBSTETRIC ULTRASOUND FINDINGS: Number of Fetuses: 1 Heart Rate:  137 bpm Movement: Yes Presentation: Cephalic Placental Location: Anterior Previa: None Amniotic Fluid (Subjective):  Within normal limits. BPD:  6.37cm 25w 5d MATERNAL FINDINGS: Cervix:  Appears closed. Uterus/Adnexae: RIGHT ovary normal size and morphology 1.8 x 3.2 x 2.7 cm. LEFT ovary normal size and morphology 4.3 x 2.2 x 2.4 cm. No free pelvic fluid. IMPRESSION: Single live intrauterine gestation as above. This exam is performed on an emergent basis and does not comprehensively evaluate fetal size, dating, or anatomy; follow-up complete OB US should be considered if further fetal assessment is warranted. Electronically  Signed   By: Ulyses Southward M.D.   On: 11/26/2017 19:32   US Renal  Result Date: 11/26/2017 CLINICAL DATA:  Left flank pain, [redacted] weeks pregnant EXAM: RENAL / URINARY TRACT ULTRASOUND COMPLETE COMPARISON:  None. FINDINGS: Right Kidney: Length: 9.4 cm. Echogenicity within normal limits. No mass or hydronephrosis visualized. Left Kidney: Length: 10 cm. Echogenicity within normal limits. No mass or hydronephrosis visualized. Bladder: Small amount of mobile debris within the bladder. IMPRESSION: 1. Negative for hydronephrosis. Normal ultrasound appearance of the kidneys 2. Small amount of echogenic debris within the bladder. Electronically Signed   By: Jasmine Pang M.D.   On: 11/26/2017 19:32    Procedures Procedures (including critical care time)  EMERGENCY DEPARTMENT [place] PREGNANCY "Study: Limited Ultrasound of the Pelvis for Pregnancy"  INDICATIONS:Pregnancy(required) Multiple views of the uterus and pelvic cavity were obtained in real-time with a multi-frequency probe.  APPROACH:Transabdominal  PERFORMED BY: Myself IMAGES ARCHIVED?: Yes LIMITATIONS: Body habitus FETAL HEART RATE: 143 INTERPRETATION: Fetal heart  activity seen     Medications Ordered in ED Medications - No data to display   Initial Impression / Assessment and Plan / ED Course  I have reviewed the triage vital signs and the nursing notes.  Pertinent labs & imaging results that were available during my care of the patient were reviewed by me and considered in my medical decision making (see chart for details).     EDISON WOLLSCHLAGER is a 32 y.o. female with a past medical history of 25-week pregnancy who presents with left-sided flank and back pain.  She reports that she is been having pain nearly constantly for the last 3 weeks.  She describes it as a sharp and cramping pain in the left flank.  She reports no history of this.  She reports that it is worsened when she tries to twist or move.  She denies any constipation or  diarrhea.  She denies any urinary symptoms or changes.  No dysuria or hematuria.  She denies any history of kidney stones.  She denies any problems with her pregnancy and denies any abdominal pain.  She denies any lightheadedness or fatigue.  She has not taken medicine to help her symptoms due to the pregnancy.  She describes her pain as a 5 out of 10 in severity currently but it gets worse when she moves.  She reports that the other day she felt a pop on the left side with the pain.  She denies recent trauma.  She denies any vaginal bleeding or vaginal discharge.  On exam, patient has tenderness in the left flank.  Abdomen is nontender.  Lungs were clear and chest was nontender.  Patient had no significant lower extremity edema or unilateral leg swelling.  No leg tenderness.  No focal neurologic deficits seen.  Patient is gravid.  Bedside ultrasound was performed showing fetal heart rate of 143.  Fetus was also very active on ultrasound moving around.  Due to patient's location of discomfort, musculoskeletal versus kidney stone versus pyelonephritis all considered for etiology of symptoms.  Patient will have ultrasound of the kidney to further evaluate.  Patient will have urinalysis and laboratory testing.  Nursing called her OB for their assessment.  Anticipate reassessment after workup.  Care transferred to Dr. Jeraldine Loots while awaiting for urinalysis and ultrasounds.  Anticipate follow-up on OB recommendations as well.  Next  Care transferred in stable condition.  Final Clinical Impressions(s) / ED Diagnoses   Final diagnoses:  Flank pain     Clinical Impression: 1. Flank pain     Disposition: CAre transferred to Dr. Jeraldine Loots while Modena Slater [place] and UA. Anticipate discharge if workup is reassuring.      Tegeler, Canary Brim, MD 11/26/17 2035

## 2017-11-27 ENCOUNTER — Encounter (HOSPITAL_COMMUNITY): Payer: Self-pay

## 2017-11-27 ENCOUNTER — Inpatient Hospital Stay (HOSPITAL_COMMUNITY)
Admission: AD | Admit: 2017-11-27 | Discharge: 2017-11-27 | Disposition: A | Discharge status: Home / Self Care | Payer: Medicaid Other | Source: Ambulatory Visit | Attending: Obstetrics and Gynecology | Admitting: Obstetrics and Gynecology

## 2017-11-27 ENCOUNTER — Other Ambulatory Visit: Payer: Self-pay

## 2017-11-27 DIAGNOSIS — Z3A26 26 weeks gestation of pregnancy: Secondary | ICD-10-CM | POA: Insufficient documentation

## 2017-11-27 DIAGNOSIS — M7918 Myalgia, other site: Secondary | ICD-10-CM | POA: Diagnosis not present

## 2017-11-27 DIAGNOSIS — R109 Unspecified abdominal pain: Secondary | ICD-10-CM | POA: Diagnosis present

## 2017-11-27 DIAGNOSIS — Z87891 Personal history of nicotine dependence: Secondary | ICD-10-CM | POA: Insufficient documentation

## 2017-11-27 DIAGNOSIS — O4702 False labor before 37 completed weeks of gestation, second trimester: Secondary | ICD-10-CM | POA: Diagnosis not present

## 2017-11-27 LAB — URINALYSIS, ROUTINE W REFLEX MICROSCOPIC
BILIRUBIN URINE: NEGATIVE
Glucose, UA: NEGATIVE mg/dL
HGB URINE DIPSTICK: NEGATIVE
Ketones, ur: NEGATIVE mg/dL
NITRITE: NEGATIVE
PH: 7 (ref 5.0–8.0)
Protein, ur: NEGATIVE mg/dL
SPECIFIC GRAVITY, URINE: 1.004 — AB (ref 1.005–1.030)

## 2017-11-27 LAB — FETAL FIBRONECTIN: Fetal Fibronectin: NEGATIVE

## 2017-11-27 MED ORDER — CYCLOBENZAPRINE HCL 5 MG PO TABS: 5.0000 mg | ORAL_TABLET | Freq: Three times a day (TID) | ORAL | 1 refills | Status: DC | PRN

## 2017-11-27 MED ORDER — NIFEDIPINE 10 MG PO CAPS
10.0000 mg | ORAL_CAPSULE | ORAL | Status: DC | PRN
  Administered 2017-11-27 (×2): 10 mg via ORAL
  Filled 2017-11-27 (×2): qty 1

## 2017-11-27 MED ORDER — LACTATED RINGERS IV BOLUS (SEPSIS)
1000.0000 mL | Freq: Once | INTRAVENOUS | Status: AC
Start: 2017-11-27 — End: 2017-11-27
  Administered 2017-11-27: 1000 mL via INTRAVENOUS

## 2017-11-27 MED ORDER — CYCLOBENZAPRINE HCL 5 MG PO TABS
5.0000 mg | ORAL_TABLET | Freq: Once | ORAL | Status: AC
  Administered 2017-11-27: 5 mg via ORAL
  Filled 2017-11-27: qty 1

## 2017-11-27 NOTE — MAU Note (Signed)
Pt presents to MAU with c/o of left sided abdominal pain and back pain that has been ongoing. Pt denies VB and LOF. +FM

## 2017-11-27 NOTE — MAU Note (Cosign Needed Addendum)
History    Chief Complaint  Patient presents with  . Abdominal Pain  . Back Pain   HPI   Deborah Sawyer is a 32 y.o. G2P1001 female at [redacted] weeks gestation who presents to the MAU today with a complaint of left-sided flank pain. She states that this pain started roughly 3 weeks ago, is achy to sharp in character and reaches 10/10 severity with movement, palpation or stretching. She has tried tylenol and heat for the pain with minimal relief. Patient did take 1/2 of a percocet last night which provided relief. She took another 1/2 Percocet this morning to allow her to sleep before coming here. Of note, patient was evaluated yesterday in the ED where the providers obtained labs and [place] which revealed no suspicious findings for pyelonephritis, ureterolithiasis, hydronephrosis or acute UTI. Patient also feels that this is more surface pain.  She recalls an inciting event roughly 3 weeks ago when she was walking down the street, "got a shiver" and felt pain in her left flank area. Since that time, the pain has not subsided and continues to worsen. Patient states that she feels herself "flexing those muscles" a lot especially when lying down. She says that the pain has gotten so intense that she has started taking more shallow breaths and trying to avoid coughing, sneezing or breathing deeply.   Otherwise, denies headache, vision changes, chest pain, palpitations, dyspnea, cough, wheeze, abdominal pain, NVD, lower extremity swelling/tingling/numbness.   While patient was in room on fetal monitoring, it was noted that she was having contractions roughly every 2-5 minutes. Patient states that she has not noticed this sensation and just assumed she was being "headbutted." Denies bleeding or vaginal discharge.  Fetal Monitoring Baseline: 145-150 bpm, Variability: moderate, Accelerations: Non-reactive but appropriate for gestational age, Decelerations: Absent,  Contractions: irregular, every 3-5 minutes  Past  Medical History:  Diagnosis Date  . Hernia of abdominal cavity age 25  . Hx of lumpectomy age 19    Past Surgical History:  Procedure Laterality Date  . HERNIA REPAIR      History reviewed. No pertinent family history.  Social History   Tobacco Use  . Smoking status: Former Smoker    Types: Cigarettes    Last attempt to quit: 06/23/2017    Years since quitting: 0.4  . Smokeless tobacco: Never Used  Substance Use Topics  . Alcohol use: No  . Drug use: No    Allergies: No Known Allergies  Medications Prior to Admission  Medication Sig Dispense Refill Last Dose  . acetaminophen (TYLENOL) 325 MG tablet Take 650 mg by mouth every 6 (six) hours as needed for headache (pain).   11/26/2017 at Unknown time  . Prenatal Multivit-Min-Fe-FA (PRENATAL VITAMINS PO) Take 1 tablet by mouth daily.    11/26/2017 at Unknown time    Review of Systems  Constitutional: Negative for chills and fever.  Gastrointestinal: Negative for abdominal pain, constipation, diarrhea, nausea and vomiting.  Genitourinary: Positive for flank pain. Negative for frequency, hematuria and urgency.  Musculoskeletal: Negative for myalgias.   Physical Exam Blood pressure 114/62, pulse 82, temperature 98.7 F (37.1 C), temperature source Oral, resp. rate 18, height 5\' 1"  (1.549 m), weight 74.8 kg (165 lb), last menstrual period 05/26/2017. Physical Exam  Constitutional: She is oriented to person, place, and time. She appears well-developed and well-nourished. No distress.  Eyes: Conjunctivae and EOM are normal. Pupils are equal, round, and reactive to light. No scleral icterus.  Cardiovascular: Normal rate and  regular rhythm. Exam reveals no gallop and no friction rub.  No murmur heard. Respiratory: Breath sounds normal. No stridor. Tachypnea noted. Respiratory distress: patient taking quick shallow breaths to prevent pain. no acute distress. She has no decreased breath sounds. She has no wheezes. She has no rhonchi. She  has no rales.  GI: Soft. There is no rebound and no guarding.  Genitourinary:  Genitourinary Comments: Closed cervix  Musculoskeletal: Normal range of motion.       Arms: Neurological: She is alert and oriented to person, place, and time.  Skin: Skin is warm and dry. No rash noted. She is not diaphoretic. No erythema. No pallor.  Psychiatric: She has a normal mood and affect. Her behavior is normal. Judgment and thought content normal.      MAU Course Procedures : none  MDM Flank pain Given timing of pain, patient feeling that it is more superficial, and tenderness to palpation, and negative workup at ED, this is likely a musculoskeletal issue. Since it is interfering with sleep and extreme discomfort to patient, believe patient could benefit from muscle relaxant. UA repeated today. Urinalysis showed small leukocyte esterase, few bacteria and 6-30 epithelial cells most consistent with a contaminated urine collection. Will send for culture. No ABX indicated at this time  Contractions Given patient gestational age and frequency of contractions, need to rule out preterm labor. Cervix currently closed. FFN collected today - neg - low risk of labor in next 2 weeks. Patient w recent decreased PO intake. 1L LR bolus and 2 doses procardia given today in clinic.   Assessment/Plan:  1. Flank Pain  -Flexeril 5mg  tid sent to patient pharmacy. May take one tablet extra with nighttime dose PRN discomfort while sleeping. Do not drive while on this medication -recommend heat and gentle stretching. Seek medical attention if severe shortness of breath, hemoptysis, palpitations.    2. Contractions of Early Pregnancy -recommend light activity for 48 hours. RTC with any signs of increasing frequency/regularity of contractions, discharge, vaginal bleeding, abdominal pain.   CNM attestation:  I have seen and examined this patient and agree with above documentation in the PA student's note.   Deborah Sawyer is a 32 y.o. G2P1001 at [redacted]w[redacted]d reporting left flank pain associate dw/ mvmt.   +FM, denies fever, chills, N/V, LOF, VB, contractions, vaginal discharge or urinary complaints.  PE: BP (!) 115/54 (BP Location: Right Arm)   Pulse 82   Temp 98.7 F (37.1 C) (Oral)   Resp 18   Ht 5\' 1"  (1.549 m)   Wt 165 lb (74.8 kg)   LMP 05/26/2017 (Exact Date)   BMI 31.18 kg/m  Gen: calm comfortable, mild distress Resp: normal effort, no distress Heart: Regular rate Abd: Soft, NT, gravid, S=D  FHR: Baseline 145, mod Variability, pos accels, no decels Toco: UC's Q 2-5 min, mild  ROS, labs, PMH reviewed  Orders Placed This Encounter  Procedures  . Culture, OB Urine  . Urinalysis, Routine w reflex microscopic  . Fetal fibronectin  . Discharge patient   Meds ordered this encounter  Medications  . DISCONTD: NIFEdipine (PROCARDIA) capsule 10 mg  . cyclobenzaprine (FLEXERIL) tablet 5 mg  . lactated ringers bolus 1,000 mL  . cyclobenzaprine (FLEXERIL) 5 MG tablet    Sig: Take 1-2 tablets (5-10 mg total) by mouth 3 (three) times daily as needed for muscle spasms.    Dispense:  30 tablet    Refill:  1    Order Specific Question:  Supervising Provider    Answer:   Levie HeritageSTINSON, JACOB J [[id number]]    MDM - Left flank pain that resolved w/ Flexeril and no other Sx of Pyelo or kidney stones. Most likely MS pain. Will send urine for culture.  - Preterm contractions w/out evidence of active preterm labor. fFN neg.   Assessment: 1. Preterm uterine contractions, antepartum, second trimester   2. Musculoskeletal pain     Plan: - Discharge home in stable condition. - PTL precautions and fetal kick counts. - Follow-up as scheduled at your doctor's office for next prenatal visit or sooner as needed if symptoms worsen. - Return to maternity admissions symptoms worsen  Katrinka BlazingSmith, [place], [place] 11/27/2017 2:00 PM

## 2017-11-27 NOTE — Discharge Instructions (Signed)
Preterm Labor and Birth Information The normal length of a pregnancy is 39-41 weeks. Preterm labor is when labor starts before 37 completed weeks of pregnancy. What are the risk factors for preterm labor? Preterm labor is more likely to occur in women who:  Have certain infections during pregnancy such as a bladder infection, sexually transmitted infection, or infection inside the uterus (chorioamnionitis).  Have a shorter-than-normal cervix.  Have gone into preterm labor before.  Have had surgery on their cervix.  Are younger than age 89 or older than age 19.  Are African American.  Are pregnant with twins or multiple babies (multiple gestation).  Take street drugs or smoke while pregnant.  Do not gain enough weight while pregnant.  Became pregnant shortly after having been pregnant.  What are the symptoms of preterm labor? Symptoms of preterm labor include:  Cramps similar to those that can happen during a menstrual period. The cramps may happen with diarrhea.  Pain in the abdomen or lower back.  Regular uterine contractions that may feel like tightening of the abdomen.  A feeling of increased pressure in the pelvis.  Increased watery or bloody mucus discharge from the vagina.  Water breaking (ruptured amniotic sac).  Why is it important to recognize signs of preterm labor? It is important to recognize signs of preterm labor because babies who are born prematurely may not be fully developed. This can put them at an increased risk for:  Long-term (chronic) heart and lung problems.  Difficulty immediately after birth with regulating body systems, including blood sugar, body temperature, heart rate, and breathing rate.  Bleeding in the brain.  Cerebral palsy.  Learning difficulties.  Death.  These risks are highest for babies who are born before 37 weeks of pregnancy. How is preterm labor treated? Treatment depends on the length of your pregnancy, your  condition, and the health of your baby. It may involve:  Having a stitch (suture) placed in your cervix to prevent your cervix from opening too early (cerclage).  Taking or being given medicines, such as: ? Hormone medicines. These may be given early in pregnancy to help support the pregnancy. ? Medicine to stop contractions. ? Medicines to help mature the babys lungs. These may be prescribed if the risk of delivery is high. ? Medicines to prevent your baby from developing cerebral palsy.  If the labor happens before 34 weeks of pregnancy, you may need to stay in the hospital. What should I do if I think I am in preterm labor? If you think that you are going into preterm labor, call your health care provider right away. How can I prevent preterm labor in future pregnancies? To increase your chance of having a full-term pregnancy:  Do not use any tobacco products, such as cigarettes, chewing tobacco, and e-cigarettes. If you need help quitting, ask your health care provider.  Do not use street drugs or medicines that have not been prescribed to you during your pregnancy.  Talk with your health care provider before taking any herbal supplements, even if you have been taking them regularly.  Make sure you gain a healthy amount of weight during your pregnancy.  Watch for infection. If you think that you might have an infection, get it checked right away.  Make sure to tell your health care provider if you have gone into preterm labor before.  This information is not intended to replace advice given to you by your health care provider. Make sure you discuss any questions  you have with your health care provider. Document Released: 12/30/2003 Document Revised: 03/21/2016 Document Reviewed: 03/01/2016 Elsevier Interactive Patient Education  2018 Elsevier Inc.   Muscle Cramps and Spasms Muscle cramps and spasms are when muscles tighten by themselves. They usually get better within minutes.  Muscle cramps are painful. They are usually stronger and last longer than muscle spasms. Muscle spasms may or may not be painful. They can last a few seconds or much longer. Follow these instructions at home:  Drink enough fluid to keep your pee (urine) clear or pale yellow.  Massage, stretch, and relax the muscle.  If directed, apply heat to tight or tense muscles as often as told by your doctor. Use the heat source that your doctor recommends. ? Place a towel between your skin and the heat source. ? Leave the heat on for 20-30 minutes. ? Take off the heat if your skin turns bright red. This is especially important if you are unable to feel pain, heat, or cold. You may have a greater risk of getting burned.  If directed, put ice on the affected area. This may help if you are sore or have pain after a cramp or spasm. ? Put ice in a plastic bag. ? Place a towel between your skin and the bag. ? Leave the ice on for 20 minutes, 2-3 times a day.  Take over-the-counter and prescription medicines only as told by your doctor.  Pay attention to any changes in your symptoms. Contact a doctor if:  Your cramps or spasms get worse or happen more often.  Your cramps or spasms do not get better with time. This information is not intended to replace advice given to you by your health care provider. Make sure you discuss any questions you have with your health care provider. Document Released: 09/21/2008 Document Revised: 11/10/2015 Document Reviewed: 07/13/2015 Elsevier Interactive Patient Education  2018 [employer]

## 2017-11-28 LAB — URINE CULTURE: Culture: <10000 — AB

## 2017-11-28 LAB — CULTURE, OB URINE
CULTURE: NO GROWTH
Special Requests: NORMAL

## 2017-12-13 ENCOUNTER — Other Ambulatory Visit: Payer: Medicaid Other

## 2017-12-13 ENCOUNTER — Ambulatory Visit (INDEPENDENT_AMBULATORY_CARE_PROVIDER_SITE_OTHER): Payer: Medicaid Other | Admitting: Obstetrics & Gynecology

## 2017-12-13 VITALS — BP 112/59 | HR 86 | Wt 165.4 lb

## 2017-12-13 DIAGNOSIS — Z3480 Encounter for supervision of other normal pregnancy, unspecified trimester: Secondary | ICD-10-CM

## 2017-12-13 DIAGNOSIS — Z23 Encounter for immunization: Secondary | ICD-10-CM

## 2017-12-13 NOTE — Progress Notes (Signed)
   PRENATAL VISIT NOTE  Subjective:  Deborah Sawyer is a 32 y.o. G2P1001 at 468w5d being seen today for ongoing prenatal care.  She is currently monitored for the following issues for this low-risk pregnancy and has Supervision of other normal pregnancy, antepartum and Trichomoniasis on their problem list.  Patient reports no complaints.  Contractions: Not present. Vag. Bleeding: None.  Movement: Present. Denies leaking of fluid.   The following portions of the patient's history were reviewed and updated as appropriate: allergies, current medications, past family history, past medical history, past social history, past surgical history and problem list. Problem list updated.  Objective:   Vitals:   12/13/17 1052  BP: (!) 112/59  Pulse: 86  Weight: 165 lb 6.4 oz (75 kg)    Fetal Status: Fetal Heart Rate (bpm): 135   Movement: Present     General:  Alert, oriented and cooperative. Patient is in no acute distress.  Skin: Skin is warm and dry. No rash noted.   Cardiovascular: Normal heart rate noted  Respiratory: Normal respiratory effort, no problems with respiration noted  Abdomen: Soft, gravid, appropriate for gestational age.  Pain/Pressure: Present     Pelvic: Cervical exam deferred        Extremities: Normal range of motion.  Edema: None  Mental Status:  Normal mood and affect. Normal behavior. Normal judgment and thought content.   Assessment and Plan:  Pregnancy: G2P1001 at 768w5d  1. Supervision of other normal pregnancy, antepartum   2. Need for diphtheria-tetanus-pertussis (Tdap) vaccine Routine 28 week labs - Tdap vaccine greater than or equal to 7yo IM  Preterm labor symptoms and general obstetric precautions including but not limited to vaginal bleeding, contractions, leaking of fluid and fetal movement were reviewed in detail with the patient. Please refer to After Visit Summary for other counseling recommendations.  Return in about 2 weeks (around  12/27/2017).   Scheryl DarterJames Treylin Burtch, MD

## 2017-12-13 NOTE — Patient Instructions (Signed)

## 2017-12-14 LAB — CBC
Hematocrit: 31 % — ABNORMAL LOW (ref 34.0–46.6)
Hemoglobin: 10 g/dL — ABNORMAL LOW (ref 11.1–15.9)
MCH: 28.7 pg (ref 26.6–33.0)
MCHC: 32.3 g/dL (ref 31.5–35.7)
MCV: 89 fL (ref 79–97)
PLATELETS: 247 10*3/uL (ref 150–379)
RBC: 3.49 x10E6/uL — AB (ref 3.77–5.28)
RDW: 15 % (ref 12.3–15.4)
WBC: 9.7 10*3/uL (ref 3.4–10.8)

## 2017-12-14 LAB — GLUCOSE TOLERANCE, 2 HOURS W/ 1HR
GLUCOSE, 1 HOUR: 144 mg/dL (ref 65–179)
GLUCOSE, 2 HOUR: 89 mg/dL (ref 65–152)
Glucose, Fasting: 84 mg/dL (ref 65–91)

## 2017-12-14 LAB — RPR: RPR: NONREACTIVE

## 2017-12-14 LAB — HIV ANTIBODY (ROUTINE TESTING W REFLEX): HIV SCREEN 4TH GENERATION: NONREACTIVE

## 2018-01-02 ENCOUNTER — Encounter: Payer: Self-pay | Admitting: Advanced Practice Midwife

## 2018-01-02 ENCOUNTER — Ambulatory Visit (INDEPENDENT_AMBULATORY_CARE_PROVIDER_SITE_OTHER): Payer: Medicaid Other | Admitting: Advanced Practice Midwife

## 2018-01-02 DIAGNOSIS — Z3480 Encounter for supervision of other normal pregnancy, unspecified trimester: Secondary | ICD-10-CM

## 2018-01-02 NOTE — Patient Instructions (Addendum)
Food Choices for Gastroesophageal Reflux Disease, Adult When you have gastroesophageal reflux disease (GERD), the foods you eat and your eating habits are very important. Choosing the right foods can help ease your discomfort. What guidelines do I need to follow?  Choose fruits, vegetables, whole grains, and low-fat dairy products.  Choose low-fat meat, fish, and poultry.  Limit fats such as oils, salad dressings, butter, nuts, and avocado.  Keep a food diary. This helps you identify foods that cause symptoms.  Avoid foods that cause symptoms. These may be different for everyone.    Eat small meals often instead of 3 large meals a day.  Eat your meals slowly, in a place where you are relaxed.  Limit fried foods.  Cook foods using methods other than frying.  Avoid drinking alcohol.  Avoid drinking large amounts of liquids with your meals.  Avoid bending over or lying down until 2-3 hours after eating. What foods are not recommended? These are some foods and drinks that may make your symptoms worse: Vegetables Tomatoes. Tomato juice. Tomato and spaghetti sauce. Chili peppers. Onion and garlic. Horseradish. Fruits Oranges, grapefruit, and lemon (fruit and juice). Meats High-fat meats, fish, and poultry. This includes hot dogs, ribs, ham, sausage, salami, and bacon. Dairy Whole milk and chocolate milk. Sour cream. Cream. Butter. Ice cream. Cream cheese. Drinks Coffee and tea. Bubbly (carbonated) drinks or energy drinks. Condiments Hot sauce. Barbecue sauce. Sweets/Desserts Chocolate and cocoa. Donuts. Peppermint and spearmint. Fats and Oils High-fat foods. This includes [place] fries and potato chips. Other Vinegar. Strong spices. This includes black pepper, white pepper, red pepper, cayenne, curry powder, cloves, ginger, and chili powder. The items listed above may not be a complete list of foods and drinks to avoid. Contact your dietitian for more information. This  information is not intended to replace advice given to you by your health care provider. Make sure you discuss any questions you have with your health care provider. Document Released: 04/09/2012 Document Revised: 03/16/2016 Document Reviewed: 08/13/2013 Elsevier Interactive Patient Education  2017 Elsevier Inc.   Ball CorporationBraxton Hicks Contractions Contractions of the uterus can occur throughout pregnancy, but they are not always a sign that you are in labor. You may have practice contractions called Braxton Hicks contractions. These false labor contractions are sometimes confused with true labor. What are Deberah PeltonBraxton Hicks contractions? Braxton Hicks contractions are tightening movements that occur in the muscles of the uterus before labor. Unlike true labor contractions, these contractions do not result in opening (dilation) and thinning of the cervix. Toward the end of pregnancy (32-34 weeks), Braxton Hicks contractions can happen more often and may become stronger. These contractions are sometimes difficult to tell apart from true labor because they can be very uncomfortable. You should not feel embarrassed if you go to the hospital with false labor. Sometimes, the only way to tell if you are in true labor is for your health care provider to look for changes in the cervix. The health care provider will do a physical exam and may monitor your contractions. If you are not in true labor, the exam should show that your cervix is not dilating and your water has not broken. If there are other health problems associated with your pregnancy, it is completely safe for you to be sent home with false labor. You may continue to have Braxton Hicks contractions until you go into true labor. How to tell the difference between true labor and false labor True labor  Contractions last 30-70 seconds.  regular. °· Discomfort is usually felt in the top of the uterus, and it spreads to the lower abdomen and  low back. °· Contractions do not go away with walking. °· Contractions usually become more intense and increase in frequency. °· The cervix dilates and gets thinner. °False labor °· Contractions are usually shorter and not as strong as true labor contractions. °· Contractions are usually irregular. °· Contractions are often felt in the front of the lower abdomen and in the groin. °· Contractions may go away when you walk around or change positions while lying down. °· Contractions get weaker and are shorter-lasting as time goes on. °· The cervix usually does not dilate or become thin. °Follow these instructions at home: °· Take over-the-counter and prescription medicines only as told by your health care provider. °· Keep up with your usual exercises and follow other instructions from your health care provider. °· Eat and drink lightly if you think you are going into labor. °· If Braxton Hicks contractions are making you uncomfortable: °? Change your position from lying down or resting to walking, or change from walking to resting. °? Sit and rest in a tub of warm water. °? Drink enough fluid to keep your urine pale yellow. Dehydration may cause these contractions. °? Do slow and deep breathing several times an hour. °· Keep all follow-up prenatal visits as told by your health care provider. This is important. °Contact a health care provider if: °· You have a fever. °· You have continuous pain in your abdomen. °Get help right away if: °· Your contractions become stronger, more regular, and closer together. °· You have fluid leaking or gushing from your vagina. °· You pass blood-tinged mucus (bloody show). °· You have bleeding from your vagina. °· You have low back pain that you never had before. °· You feel your baby’s head pushing down and causing pelvic pressure. °· Your baby is not moving inside you as much as it used to. °Summary °· Contractions that occur before labor are called Braxton Hicks contractions, false  labor, or practice contractions. °· Braxton Hicks contractions are usually shorter, weaker, farther apart, and less regular than true labor contractions. True labor contractions usually become progressively stronger and regular and they become more frequent. °· Manage discomfort from Braxton Hicks contractions by changing position, resting in a warm bath, drinking plenty of water, or practicing deep breathing. °This information is not intended to replace advice given to you by your health care provider. Make sure you discuss any questions you have with your health care provider. °Document Released: 02/22/2017 Document Revised: 02/22/2017 Document Reviewed: 02/22/2017 °Elsevier Interactive Patient Education © 2018 Elsevier Inc. ° °

## 2018-01-02 NOTE — Progress Notes (Signed)
   PRENATAL VISIT NOTE  Subjective:  Deborah Sawyer is a 32 y.o. G2P1001 at 1695w4d being seen today for ongoing prenatal care.  She is currently monitored for the following issues for this low-risk pregnancy and has Supervision of other normal pregnancy, antepartum and Trichomoniasis on their problem list.  Patient reports heartburn.  Contractions: Not present. Vag. Bleeding: None.  Movement: Present. Denies leaking of fluid.   The following portions of the patient's history were reviewed and updated as appropriate: allergies, current medications, past family history, past medical history, past social history, past surgical history and problem list. Problem list updated.  Objective:   Vitals:   01/02/18 0822  BP: 117/64  Pulse: 82  Weight: 172 lb 3.2 oz (78.1 kg)    Fetal Status: Fetal Heart Rate (bpm): 144   Movement: Present    Fundal Ht 32  General:  Alert, oriented and cooperative. Patient is in no acute distress.  Skin: Skin is warm and dry. No rash noted.   Cardiovascular: Normal heart rate noted  Respiratory: Normal respiratory effort, no problems with respiration noted  Abdomen: Soft, gravid, appropriate for gestational age.  Pain/Pressure: Absent     Pelvic: Cervical exam deferred        Extremities: Normal range of motion.  Edema: None  Mental Status:  Normal mood and affect. Normal behavior. Normal judgment and thought content.   Assessment and Plan:  Pregnancy: G2P1001 at [redacted]w[redacted]d  1. Supervision of other normal pregnancy, antepartum  2. Heartburn  - Pepcid PRN  Preterm labor symptoms and general obstetric precautions including but not limited to vaginal bleeding, contractions, leaking of fluid and fetal movement were reviewed in detail with the patient. Please refer to After Visit Summary for other counseling recommendations.  Return in about 2 weeks (around 01/16/2018) for ROB.   Dorathy KinsmanVirginia Joda Braatz, CNM

## 2018-01-16 ENCOUNTER — Ambulatory Visit (INDEPENDENT_AMBULATORY_CARE_PROVIDER_SITE_OTHER): Payer: Medicaid Other | Admitting: Medical

## 2018-01-16 VITALS — BP 126/78 | HR 79 | Wt 171.8 lb

## 2018-01-16 DIAGNOSIS — Z3480 Encounter for supervision of other normal pregnancy, unspecified trimester: Secondary | ICD-10-CM

## 2018-01-16 NOTE — Patient Instructions (Signed)

## 2018-01-16 NOTE — Progress Notes (Signed)
   PRENATAL VISIT NOTE  Subjective:  Deborah Sawyer is a 32 y.o. G2P1001 at 6359w4d being seen today for ongoing prenatal care.  She is currently monitored for the following issues for this low-risk pregnancy and has Supervision of other normal pregnancy, antepartum and Trichomoniasis on their problem list.  Patient reports no complaints.  Contractions: Not present. Vag. Bleeding: None.  Movement: Present. Denies leaking of fluid.   The following portions of the patient's history were reviewed and updated as appropriate: allergies, current medications, past family history, past medical history, past social history, past surgical history and problem list. Problem list updated.  Objective:   Vitals:   01/16/18 1105  BP: 126/78  Pulse: 79  Weight: 171 lb 12.8 oz (77.9 kg)    Fetal Status: Fetal Heart Rate (bpm): 125 Fundal Height: 34 cm Movement: Present     General:  Alert, oriented and cooperative. Patient is in no acute distress.  Skin: Skin is warm and dry. No rash noted.   Cardiovascular: Normal heart rate noted  Respiratory: Normal respiratory effort, no problems with respiration noted  Abdomen: Soft, gravid, appropriate for gestational age.  Pain/Pressure: Present     Pelvic: Cervical exam deferred        Extremities: Normal range of motion.  Edema: None  Mental Status:  Normal mood and affect. Normal behavior. Normal judgment and thought content.   Assessment and Plan:  Pregnancy: G2P1001 at 7359w4d  1. Supervision of other normal pregnancy, antepartum - Doing well - Back pain, muscle strain, has resolved, no longer needing Flexeril or Tylenol regularly  - Discussed GBS and GC/Chlamydia at next visit  Preterm labor symptoms and general obstetric precautions including but not limited to vaginal bleeding, contractions, leaking of fluid and fetal movement were reviewed in detail with the patient. Please refer to After Visit Summary for other counseling recommendations.  Return  in about 2 weeks (around 01/30/2018) for LOB.   Vonzella NippleJulie Mc Bloodworth, PA-C

## 2018-01-30 ENCOUNTER — Ambulatory Visit (INDEPENDENT_AMBULATORY_CARE_PROVIDER_SITE_OTHER): Payer: Medicaid Other | Admitting: Advanced Practice Midwife

## 2018-01-30 ENCOUNTER — Other Ambulatory Visit (HOSPITAL_COMMUNITY)
Admission: RE | Admit: 2018-01-30 | Discharge: 2018-01-30 | Disposition: A | Discharge status: Home / Self Care | Payer: Medicaid Other | Source: Ambulatory Visit | Attending: Advanced Practice Midwife | Admitting: Advanced Practice Midwife

## 2018-01-30 VITALS — BP 136/75 | HR 89 | Wt 174.4 lb

## 2018-01-30 DIAGNOSIS — Z3480 Encounter for supervision of other normal pregnancy, unspecified trimester: Secondary | ICD-10-CM

## 2018-01-30 DIAGNOSIS — Z3A35 35 weeks gestation of pregnancy: Secondary | ICD-10-CM | POA: Insufficient documentation

## 2018-01-30 DIAGNOSIS — Z3483 Encounter for supervision of other normal pregnancy, third trimester: Secondary | ICD-10-CM

## 2018-01-30 LAB — OB RESULTS CONSOLE GBS: GBS: NEGATIVE

## 2018-01-30 LAB — OB RESULTS CONSOLE GC/CHLAMYDIA: Gonorrhea: NEGATIVE

## 2018-01-30 NOTE — Patient Instructions (Signed)
AREA PEDIATRIC/FAMILY PRACTICE PHYSICIANS  [facility] [address], [address] [place], [place]  [address] Phone - [phone]   Fax - [phone]  [facility] [address] [place], [place] [address] Phone - [phone]   Fax - [phone]  JACK AMOS [address] [place], [place]  [address] Phone - [phone]   Fax - [phone]  [facility] [address] [place], [place]  [address] Phone - [phone]   Fax - [phone]  [facility] [address] [place], [place]  [address] Phone - [phone]   Fax - [phone]  [facility] [address], [address] [place], [place]  [address] Phone - [phone]   Fax - [phone]  [facility] [address], [address] [place], [place]  [address] Phone - [phone]   Fax - [phone]  [facility] [address], [address] [place], [place]  [address] Phone - [phone]   Fax - [phone]  [facility] [address] [place], [place]  [address] Phone - [phone]   Fax - [phone] [facility] [address] [place], [place]  [address] Phone - [phone]   Fax - [phone]  [facility] [address] [place], [place]  [address] Phone - [phone]   Fax - [phone]  [facility] [address] [place], [place]  [address] Phone - [phone]   Fax - [phone]  [facility] [address], [address] [place], [place]  [address] Phone - [phone]   Fax - [phone]  SHILPA GOSRANI [address] [place], [place]  [address] Phone - [phone]  [facility] [address] [place], [place]  [address] Phone - [phone]   Fax - [phone]  [facility] [address] [place], [place]  [address] Phone - [phone]   Fax - [phone]  [facility] [address] [place], [place]  [address] Phone - [phone]   Fax - [phone]  [facility] [address] [place], [place]  [address] Phone - [phone]   Fax - [phone]   [facility] [address] [place], [place]  [address] Phone - [phone]   Fax - [phone]  [facility] [address] [place], [place]  [address] Phone - [phone]   Fax - [phone]  DAVID RUBIN [address] [place], [place]  [address] Phone - [phone]   Fax - [phone]  [facility] [address] [place], [place]  [address] Phone - [phone]   Fax - [phone]  [facility] [address] , [place]  [address] Phone - [phone]   Fax - [phone] [facility] [address] [place], [place]  [address] Phone - [phone]   Fax - [phone]  [facility] [address] [place], [place]  [address] Phone - [phone]   Fax - [phone]  [facility]  [address] , [place]  [address] Phone - [phone]   Fax - [phone]  [facility] Charlene Flemming MD [address] [place] [address] Phone [phone]  Fax [phone]   Contraception Choices Contraception, also called birth control, refers to methods or devices that prevent   pregnancy. Hormonal methods Contraceptive implant A contraceptive implant is a thin, plastic tube that contains a hormone. It is inserted into the upper part of the arm. It can remain in place for up to 3 years. Progestin-only injections Progestin-only injections are injections of progestin, a synthetic form of the hormone progesterone. They are given every 3 months by a health care  provider. Birth control pills Birth control pills are pills that contain hormones that prevent pregnancy. They must be taken once a day, preferably at the same time each day. Birth control patch The birth control patch contains hormones that prevent pregnancy. It is placed on the skin and must be changed once a week for three weeks and removed on the fourth week. A prescription is needed to use this method of contraception. Vaginal ring A vaginal ring contains hormones that prevent pregnancy. It is placed in the vagina for three weeks and removed on the fourth week. After that, the process is repeated with a new ring. A prescription is needed to use this method of contraception. Emergency contraceptive Emergency contraceptives prevent pregnancy after unprotected sex. They come in pill form and can be taken up to 5 days after sex. They work best the sooner they are taken after having sex. Most emergency contraceptives are available without a prescription. This method should not be used as your only form of birth control. Barrier methods Female condom A female condom is a thin sheath that is worn over the penis during sex. Condoms keep sperm from going inside a woman's body. They can be used with a spermicide to increase their effectiveness. They should be disposed after a single use. Female condom A female condom is a soft, loose-fitting sheath that is put into the vagina before sex. The condom keeps sperm from going inside a woman's body. They should be disposed after a single use. Diaphragm A diaphragm is a soft, dome-shaped barrier. It is inserted into the vagina before sex, along with a spermicide. The diaphragm blocks sperm from entering the uterus, and the spermicide kills sperm. A diaphragm should be left in the vagina for 6-8 hours after sex and removed within 24 hours. A diaphragm is prescribed and fitted by a health care provider. A diaphragm should be replaced every 1-2 years, after giving  birth, after gaining more than 15 lb (6.8 kg), and after pelvic surgery. Cervical cap A cervical cap is a round, soft latex or plastic cup that fits over the cervix. It is inserted into the vagina before sex, along with spermicide. It blocks sperm from entering the uterus. The cap should be left in place for 6-8 hours after sex and removed within 48 hours. A cervical cap must be prescribed and fitted by a health care provider. It should be replaced every 2 years. Sponge A sponge is a soft, circular piece of polyurethane foam with spermicide on it. The sponge helps block sperm from entering the uterus, and the spermicide kills sperm. To use it, you make it wet and then insert it into the vagina. It should be inserted before sex, left in for at least 6 hours after sex, and removed and thrown away within 30 hours. Spermicides Spermicides are chemicals that kill or block sperm from entering the cervix and uterus. They can come as a cream, jelly, suppository, foam, or tablet. A spermicide should be inserted into the vagina with an applicator at least 10-15 minutes before sex to allow time for it to work. The process must be repeated   every time you have sex. Spermicides do not require a prescription. Intrauterine contraception Intrauterine device (IUD) An IUD is a T-shaped device that is put in a woman's uterus. There are two types:  Hormone IUD.This type contains progestin, a synthetic form of the hormone progesterone. This type can stay in place for 3-5 years.  Copper IUD.This type is wrapped in copper wire. It can stay in place for 10 years.  Permanent methods of contraception Female tubal ligation In this method, a woman's fallopian tubes are sealed, tied, or blocked during surgery to prevent eggs from traveling to the uterus. Hysteroscopic sterilization In this method, a small, flexible insert is placed into each fallopian tube. The inserts cause scar tissue to form in the fallopian tubes and block  them, so sperm cannot reach an egg. The procedure takes about 3 months to be effective. Another form of birth control must be used during those 3 months. Female sterilization This is a procedure to tie off the tubes that carry sperm (vasectomy). After the procedure, the man can still ejaculate fluid (semen). Natural planning methods Natural family planning In this method, a couple does not have sex on days when the woman could become pregnant. Calendar method This means keeping track of the length of each menstrual cycle, identifying the days when pregnancy can happen, and not having sex on those days. Ovulation method In this method, a couple avoids sex during ovulation. Symptothermal method This method involves not having sex during ovulation. The woman typically checks for ovulation by watching changes in her temperature and in the consistency of cervical mucus. Post-ovulation method In this method, a couple waits to have sex until after ovulation. Summary  Contraception, also called birth control, means methods or devices that prevent pregnancy.  Hormonal methods of contraception include implants, injections, pills, patches, vaginal rings, and emergency contraceptives.  Barrier methods of contraception can include female condoms, female condoms, diaphragms, cervical caps, sponges, and spermicides.  There are two types of IUDs (intrauterine devices). An IUD can be put in a woman's uterus to prevent pregnancy for 3-5 years.  Permanent sterilization can be done through a procedure for males, females, or both.  Natural family planning methods involve not having sex on days when the woman could become pregnant. This information is not intended to replace advice given to you by your health care provider. Make sure you discuss any questions you have with your health care provider. Document Released: 10/09/2005 Document Revised: 11/11/2016 Document Reviewed: 11/11/2016 Elsevier Interactive  Patient Education  2018 Elsevier Inc.  

## 2018-01-30 NOTE — Progress Notes (Incomplete)
   PRENATAL VISIT NOTE  Subjective:  Deborah Sawyer is a 32 y.o. G2P1001 at [redacted]w[redacted]d being seen today for ongoing prenatal care.  She is currently monitored for the following issues for this {Blank single:19197::"high-risk","low-risk"} pregnancy and has Supervision of other normal pregnancy, antepartum and Trichomoniasis on their problem list.  Patient reports {sx:14538}.  Contractions: Irritability. Vag. Bleeding: None.  Movement: Present. Denies leaking of fluid.   The following portions of the patient's history were reviewed and updated as appropriate: allergies, current medications, past family history, past medical history, past social history, past surgical history and problem list. Problem list updated.  Objective:   Vitals:   01/30/18 0901  BP: 136/75  Pulse: 89  Weight: 174 lb 6.4 oz (79.1 kg)    Fetal Status: Fetal Heart Rate (bpm): 134 Fundal Height: 35 cm Movement: Present  Presentation: Vertex  General:  Alert, oriented and cooperative. Patient is in no acute distress.  Skin: Skin is warm and dry. No rash noted.   Cardiovascular: Normal heart rate noted  Respiratory: Normal respiratory effort, no problems with respiration noted  Abdomen: Soft, gravid, appropriate for gestational age.  Pain/Pressure: Present     Pelvic: {Blank single:19197::"Cervical exam performed","Cervical exam deferred"} Dilation: Closed Effacement (%): 0 Station: Ballotable  Extremities: Normal range of motion.  Edema: Trace  Mental Status: Normal mood and affect. Normal behavior. Normal judgment and thought content.   Assessment and Plan:  Pregnancy: G2P1001 at [redacted]w[redacted]d  1. Supervision of other normal pregnancy, antepartum *** - Culture, beta strep (group b only) - GC/Chlamydia probe amp ([place])not at [facility]  {Blank single:19197::"Term","Preterm"} labor symptoms and general obstetric precautions including but not limited to vaginal bleeding, contractions, leaking of fluid and fetal movement were  reviewed in detail with the patient. Please refer to After Visit Summary for other counseling recommendations.  No follow-ups on file.  Future Appointments  Date Time Provider Department Center  02/06/2018  8:15 AM Brett Darko, CNM [facility]  02/13/2018  8:15 AM Lawrence, Erin, NP [facility]    Alithia Zavaleta, CNM 

## 2018-01-30 NOTE — Progress Notes (Incomplete)
   PRENATAL VISIT NOTE  Subjective:  Deborah Sawyer is a 31 y.o. G2P1001 at [redacted]w[redacted]d being seen today for ongoing prenatal care.  She is currently monitored for the following issues for this {Blank single:19197::"high-risk","low-risk"} pregnancy and has Supervision of other normal pregnancy, antepartum and Trichomoniasis on their problem list.  Patient reports {sx:14538}.  Contractions: Irritability. Vag. Bleeding: None.  Movement: Present. Denies leaking of fluid.   The following portions of the patient's history were reviewed and updated as appropriate: allergies, current medications, past family history, past medical history, past social history, past surgical history and problem list. Problem list updated.  Objective:   Vitals:   01/30/18 0901  BP: 136/75  Pulse: 89  Weight: 174 lb 6.4 oz (79.1 kg)    Fetal Status: Fetal Heart Rate (bpm): 134 Fundal Height: 35 cm Movement: Present  Presentation: Vertex  General:  Alert, oriented and cooperative. Patient is in no acute distress.  Skin: Skin is warm and dry. No rash noted.   Cardiovascular: Normal heart rate noted  Respiratory: Normal respiratory effort, no problems with respiration noted  Abdomen: Soft, gravid, appropriate for gestational age.  Pain/Pressure: Present     Pelvic: {Blank single:19197::"Cervical exam performed","Cervical exam deferred"} Dilation: Closed Effacement (%): 0 Station: Ballotable  Extremities: Normal range of motion.  Edema: Trace  Mental Status: Normal mood and affect. Normal behavior. Normal judgment and thought content.   Assessment and Plan:  Pregnancy: G2P1001 at [redacted]w[redacted]d  1. Supervision of other normal pregnancy, antepartum *** - Culture, beta strep (group b only) - GC/Chlamydia probe amp ([place])not at [facility]  {Blank single:19197::"Term","Preterm"} labor symptoms and general obstetric precautions including but not limited to vaginal bleeding, contractions, leaking of fluid and fetal movement were  reviewed in detail with the patient. Please refer to After Visit Summary for other counseling recommendations.  No follow-ups on file.  Future Appointments  Date Time Provider Department Center  02/06/2018  8:15 AM Smith, [place], CNM [facility]  02/13/2018  8:15 AM Lawrence, Erin, NP [facility]    [place]ia Smith, CNM 

## 2018-01-30 NOTE — Progress Notes (Incomplete)
   PRENATAL VISIT NOTE  Subjective:  Deborah Sawyer is a 32 y.o. G2P1001 at 9657w4d being seen today for ongoing prenatal care.  She is currently monitored for the following issues for this {Blank single:19197::"high-risk","low-risk"} pregnancy and has Supervision of other normal pregnancy, antepartum and Trichomoniasis on their problem list.  Patient reports {sx:14538}.  Contractions: Irritability. Vag. Bleeding: None.  Movement: Present. Denies leaking of fluid.   The following portions of the patient's history were reviewed and updated as appropriate: allergies, current medications, past family history, past medical history, past social history, past surgical history and problem list. Problem list updated.  Objective:   Vitals:   01/30/18 0901  BP: 136/75  Pulse: 89  Weight: 174 lb 6.4 oz (79.1 kg)    Fetal Status: Fetal Heart Rate (bpm): 134 Fundal Height: 35 cm Movement: Present  Presentation: Vertex  General:  Alert, oriented and cooperative. Patient is in no acute distress.  Skin: Skin is warm and dry. No rash noted.   Cardiovascular: Normal heart rate noted  Respiratory: Normal respiratory effort, no problems with respiration noted  Abdomen: Soft, gravid, appropriate for gestational age.  Pain/Pressure: Present     Pelvic: {Blank single:19197::"Cervical exam performed","Cervical exam deferred"} Dilation: Closed Effacement (%): 0 Station: Ballotable  Extremities: Normal range of motion.  Edema: Trace  Mental Status: Normal mood and affect. Normal behavior. Normal judgment and thought content.   Assessment and Plan:  Pregnancy: G2P1001 at 2857w4d  1. Supervision of other normal pregnancy, antepartum *** - Culture, beta strep (group b only) - [facility]  {Blank single:19197::"Term","Preterm"} labor symptoms and general obstetric precautions including but not limited to vaginal bleeding, contractions, leaking of fluid and fetal movement were  reviewed in detail with the patient. Please refer to After Visit Summary for other counseling recommendations.  No follow-ups on file.  Future Appointments  Date Time Provider Department Center  02/06/2018  8:15 AM Dorathy KinsmanSmith, [place], Ina HomesCNM [facility]  02/13/2018  8:15 AM Judeth HornLawrence, Erin, [facility]    Dorathy KinsmanVirginia Smith, [place]

## 2018-01-30 NOTE — Progress Notes (Signed)
   PRENATAL VISIT NOTE  Subjective:  Deborah Sawyer is a 32 y.o. G2P1001 at 1240w4d being seen today for ongoing prenatal care.  She is currently monitored for the following issues for this low-risk pregnancy and has Supervision of other normal pregnancy, antepartum and Trichomoniasis on their problem list.  Patient reports swelling of feet.  Contractions: Irritability. Vag. Bleeding: None.  Movement: Present. Denies leaking of fluid.   The following portions of the patient's history were reviewed and updated as appropriate: allergies, current medications, past family history, past medical history, past social history, past surgical history and problem list. Problem list updated.  Objective:   Vitals:   01/30/18 0901  BP: 136/75  Pulse: 89  Weight: 174 lb 6.4 oz (79.1 kg)    Fetal Status: Fetal Heart Rate (bpm): 134 Fundal Height: 35 cm Movement: Present  Presentation: Vertex  General:  Alert, oriented and cooperative. Patient is in no acute distress.  Skin: Skin is warm and dry. No rash noted.   Cardiovascular: Normal heart rate noted  Respiratory: Normal respiratory effort, no problems with respiration noted  Abdomen: Soft, gravid, appropriate for gestational age.  Pain/Pressure: Present     Pelvic: Cervical exam performed Dilation: Closed Effacement (%): 0 Station: Ballotable  Extremities: Normal range of motion.  Edema: Trace  Mental Status: Normal mood and affect. Normal behavior. Normal judgment and thought content.   Assessment and Plan:  Pregnancy: G2P1001 at 8840w4d  1. Supervision of other normal pregnancy, antepartum  - Culture, beta strep (group b only)  - [facility]  Preterm labor symptoms and general obstetric precautions including but not limited to vaginal bleeding, contractions, leaking of fluid and fetal movement were reviewed in detail with the patient. Please refer to After Visit Summary for other counseling recommendations.      Future Appointments  Date Time Provider Department Center  02/06/2018  8:15 AM Dorathy KinsmanSmith, Antione Obar, Ina HomesCNM [facility]  02/13/2018  8:15 AM Judeth HornLawrence, Erin, [facility]    Dorathy KinsmanVirginia Meli Faley, [place] Island[place]M

## 2018-01-31 LAB — GC/CHLAMYDIA PROBE AMP (~~LOC~~) NOT AT ARMC
CHLAMYDIA, DNA PROBE: NEGATIVE
Neisseria Gonorrhea: NEGATIVE

## 2018-02-03 LAB — CULTURE, BETA STREP (GROUP B ONLY): Strep Gp B Culture: NEGATIVE

## 2018-02-06 ENCOUNTER — Ambulatory Visit (INDEPENDENT_AMBULATORY_CARE_PROVIDER_SITE_OTHER): Payer: Medicaid Other | Admitting: Advanced Practice Midwife

## 2018-02-06 DIAGNOSIS — Z3483 Encounter for supervision of other normal pregnancy, third trimester: Secondary | ICD-10-CM

## 2018-02-06 DIAGNOSIS — Z3480 Encounter for supervision of other normal pregnancy, unspecified trimester: Secondary | ICD-10-CM

## 2018-02-06 NOTE — Progress Notes (Deleted)
   PRENATAL VISIT NOTE  Subjective:  Deborah Sawyer is a 32 y.o. G2P1001 at 4581w4d being seen today for ongoing prenatal care.  She is currently monitored for the following issues for this {Blank single:19197::"high-risk","low-risk"} pregnancy and has Supervision of other normal pregnancy, antepartum and Trichomoniasis on their problem list.  Patient reports {sx:14538}.  Contractions: Irritability. Vag. Bleeding: None.  Movement: Present. Denies leaking of fluid.   The following portions of the patient's history were reviewed and updated as appropriate: allergies, current medications, past family history, past medical history, past social history, past surgical history and problem list. Problem list updated.  Objective:   Vitals:   02/06/18 0829  BP: 123/70  Pulse: 89  Weight: 172 lb 12.8 oz (78.4 kg)    Fetal Status: Fetal Heart Rate (bpm): 152   Movement: Present     General:  Alert, oriented and cooperative. Patient is in no acute distress.  Skin: Skin is warm and dry. No rash noted.   Cardiovascular: Normal heart rate noted  Respiratory: Normal respiratory effort, no problems with respiration noted  Abdomen: Soft, gravid, appropriate for gestational age.  Pain/Pressure: Absent     Pelvic: {Blank single:19197::"Cervical exam performed","Cervical exam deferred"}        Extremities: Normal range of motion.  Edema: None  Mental Status: Normal mood and affect. Normal behavior. Normal judgment and thought content.   Assessment and Plan:  Pregnancy: G2P1001 at 2681w4d  1. Supervision of other normal pregnancy, antepartum ***  {Blank single:19197::"Term","Preterm"} labor symptoms and general obstetric precautions including but not limited to vaginal bleeding, contractions, leaking of fluid and fetal movement were reviewed in detail with the patient. Please refer to After Visit Summary for other counseling recommendations.  No follow-ups on file.  Future Appointments  Date Time  Provider Department Center  02/13/2018  8:15 AM Judeth HornLawrence, Erin, [facility]  02/20/2018  9:55 AM Judeth HornLawrence, Erin, NP [facility]  02/27/2018 11:15 AM Marny LowensteinWenzel, Julie N, PA-C [facility]  03/06/2018  8:15 AM Katrinka BlazingSmith, [place], CNM [facility]    [place] Emry Tobin, [place]

## 2018-02-06 NOTE — Patient Instructions (Signed)
[email or web address]   Braxton Hicks Contractions Contractions of the uterus can occur throughout pregnancy, but they are not always a sign that you are in labor. You may have practice contractions called Braxton Hicks contractions. These false labor contractions are sometimes confused with true labor. What are Deberah PeltonBraxton Hicks contractions? Braxton Hicks contractions are tightening movements that occur in the muscles of the uterus before labor. Unlike true labor contractions, these contractions do not result in opening (dilation) and thinning of the cervix. Toward the end of pregnancy (32-34 weeks), Braxton Hicks contractions can happen more often and may become stronger. These contractions are sometimes difficult to tell apart from true labor because they can be very uncomfortable. You should not feel embarrassed if you go to the hospital with false labor. Sometimes, the only way to tell if you are in true labor is for your health care provider to look for changes in the cervix. The health care provider will do a physical exam and may monitor your contractions. If you are not in true labor, the exam should show that your cervix is not dilating and your water has not broken. If there are other health problems associated with your pregnancy, it is completely safe for you to be sent home with false labor. You may continue to have Braxton Hicks contractions until you go into true labor. How to tell the difference between true labor and false labor True labor  Contractions last 30-70 seconds.  Contractions become very regular.  Discomfort is usually felt in the top of the uterus, and it spreads to the lower abdomen and low back.  Contractions do not go away with walking.  Contractions usually become more intense and increase in frequency.  The cervix dilates and gets thinner. False labor  Contractions are usually shorter and not as strong as true labor contractions.  Contractions are usually  irregular.  Contractions are often felt in the front of the lower abdomen and in the groin.  Contractions may go away when you walk around or change positions while lying down.  Contractions get weaker and are shorter-lasting as time goes on.  The cervix usually does not dilate or become thin. Follow these instructions at home:  Take over-the-counter and prescription medicines only as told by your health care provider.  Keep up with your usual exercises and follow other instructions from your health care provider.  Eat and drink lightly if you think you are going into labor.  If Braxton Hicks contractions are making you uncomfortable: ? Change your position from lying down or resting to walking, or change from walking to resting. ? Sit and rest in a tub of warm water. ? Drink enough fluid to keep your urine pale yellow. Dehydration may cause these contractions. ? Do slow and deep breathing several times an hour.  Keep all follow-up prenatal visits as told by your health care provider. This is important. Contact a health care provider if:  You have a fever.  You have continuous pain in your abdomen. Get help right away if:  Your contractions become stronger, more regular, and closer together.  You have fluid leaking or gushing from your vagina.  You pass blood-tinged mucus (bloody show).  You have bleeding from your vagina.  You have low back pain that you never had before.  You feel your baby's head pushing down and causing pelvic pressure.  Your baby is not moving inside you as much as it used to. Summary  Contractions that occur before labor  are called Braxton Hicks contractions, false labor, or practice contractions.  Braxton Hicks contractions are usually shorter, weaker, farther apart, and less regular than true labor contractions. True labor contractions usually become progressively stronger and regular and they become more frequent.  Manage discomfort from  [facility] contractions by changing position, resting in a warm bath, drinking plenty of water, or practicing deep breathing. This information is not intended to replace advice given to you by your health care provider. Make sure you discuss any questions you have with your health care provider. Document Released: 02/22/2017 Document Revised: 02/22/2017 Document Reviewed: 02/22/2017 Elsevier Interactive Patient Education  2018 [employer].

## 2018-02-06 NOTE — Progress Notes (Signed)
   PRENATAL VISIT NOTE  Subjective:  Deborah Sawyer is a 32 y.o. G2P1001 at 3751w4d being seen today for ongoing prenatal care.  She is currently monitored for the following issues for this low-risk pregnancy and has Supervision of other normal pregnancy, antepartum and Trichomoniasis on their problem list.  Patient reports occasional contractions.  Contractions: Irritability. Vag. Bleeding: None.  Movement: Present. Denies leaking of fluid.   The following portions of the patient's history were reviewed and updated as appropriate: allergies, current medications, past family history, past medical history, past social history, past surgical history and problem list. Problem list updated.  Objective:   Vitals:   02/06/18 0829  BP: 123/70  Pulse: 89  Weight: 172 lb 12.8 oz (78.4 kg)    Fetal Status: Fetal Heart Rate (bpm): 152   Movement: Present     General:  Alert, oriented and cooperative. Patient is in no acute distress.  Skin: Skin is warm and dry. No rash noted.   Cardiovascular: Normal heart rate noted  Respiratory: Normal respiratory effort, no problems with respiration noted  Abdomen: Soft, gravid, appropriate for gestational age.  Pain/Pressure: Absent     Pelvic: Cervical exam deferred        Extremities: Normal range of motion.  Edema: None  Mental Status: Normal mood and affect. Normal behavior. Normal judgment and thought content.   GBS neg   Assessment and Plan:  Pregnancy: G2P1001 at 7951w4d  1. Supervision of other normal pregnancy, antepartum   Term labor symptoms and general obstetric precautions including but not limited to vaginal bleeding, contractions, leaking of fluid and fetal movement were reviewed in detail with the patient. Please refer to After Visit Summary for other counseling recommendations.  Return in about 1 week (around 02/13/2018) for ROB.  Future Appointments  Date Time Provider Department Center  02/13/2018  8:15 AM Judeth HornLawrence, Erin, NP [facility]  [facility]  02/20/2018  9:55 AM Jud[facility] HornLawrence, Erin[facility]  02/27/2018 11:15 AM Marny LowensteinWenzel, Julie N, PA-C [facility]  03/06/2018  8:15 AM Katrinka BlazingSmith, [place], CNM [facility]    [place], [place]

## 2018-02-13 ENCOUNTER — Ambulatory Visit (INDEPENDENT_AMBULATORY_CARE_PROVIDER_SITE_OTHER): Payer: Medicaid Other | Admitting: Student

## 2018-02-13 VITALS — BP 120/68 | HR 80 | Wt 173.9 lb

## 2018-02-13 DIAGNOSIS — Z3483 Encounter for supervision of other normal pregnancy, third trimester: Secondary | ICD-10-CM

## 2018-02-13 DIAGNOSIS — Z3480 Encounter for supervision of other normal pregnancy, unspecified trimester: Secondary | ICD-10-CM

## 2018-02-13 NOTE — Progress Notes (Signed)
   PRENATAL VISIT NOTE  Subjective:  Deborah Sawyer is a 32 y.o. G2P1001 at 6399w4d being seen today for ongoing prenatal care.  She is currently monitored for the following issues for this low-risk pregnancy and has Supervision of other normal pregnancy, antepartum and Trichomoniasis on their problem list.  Patient reports no complaints.  Contractions: Irritability. Vag. Bleeding: None.  Movement: Present. Denies leaking of fluid.   The following portions of the patient's history were reviewed and updated as appropriate: allergies, current medications, past family history, past medical history, past social history, past surgical history and problem list. Problem list updated.  Objective:   Vitals:   02/13/18 0820  BP: 120/68  Pulse: 80  Weight: 173 lb 14.4 oz (78.9 kg)    Fetal Status: Fetal Heart Rate (bpm): 143 Fundal Height: 38 cm Movement: Present  Presentation: Vertex  General:  Alert, oriented and cooperative. Patient is in no acute distress.  Skin: Skin is warm and dry. No rash noted.   Cardiovascular: Normal heart rate noted  Respiratory: Normal respiratory effort, no problems with respiration noted  Abdomen: Soft, gravid, appropriate for gestational age.  Pain/Pressure: Present     Pelvic: Cervical exam performed Dilation: Closed Effacement (%): 50 Station: -3  Extremities: Normal range of motion.  Edema: None  Mental Status: Normal mood and affect. Normal behavior. Normal judgment and thought content.   Assessment and Plan:  Pregnancy: G2P1001 at 6899w4d  1. Supervision of other normal pregnancy, antepartum -doing well -Talked about contraception -- still undecided. Has list of options at home.   Term labor symptoms and general obstetric precautions including but not limited to vaginal bleeding, contractions, leaking of fluid and fetal movement were reviewed in detail with the patient. Please refer to After Visit Summary for other counseling recommendations.  Return in  about 1 week (around 02/20/2018) for Routine OB.  Future Appointments  Date Time Provider Department Center  02/20/2018  9:55 AM Judeth HornLawrence, Kaeli Nichelson, NP [facility]  02/27/2018 11:15 AM Kathlene CoteWenzel, Julie N, PA-C [facility]  03/06/2018  8:15 AM Katrinka BlazingSmith, [place], CNM [facility]    Judeth HornErin Jamen Loiseau, NP

## 2018-02-13 NOTE — Patient Instructions (Signed)
Before Baby Comes Home  Before your baby arrives it is important to:   Have all of the supplies that you will need to care for your baby.   Know where to go if there is an emergency.   Discuss the baby's arrival with other family members.    What supplies will I need?    It is recommended that you have the following supplies:  Large Items   Crib.   Crib mattress.   Rear-facing infant car seat. If possible, have a trained professional check to make sure that it is installed correctly.    Feeding   6-8 bottles that are 4-5 oz in size.   6-8 nipples.   Bottle brush.   Sterilizer, or a large pan or kettle with a lid.   A way to boil and cool water.   If you will be breastfeeding:  ? Breast pump.  ? Nipple cream.  ? Nursing bra.  ? Breast pads.  ? Breast shields.   If you will be formula feeding:  ? Formula.  ? Measuring cups.  ? Measuring spoons.    Bathing   Mild baby soap and baby shampoo.   Petroleum jelly.   Soft cloth towel and washcloth.   Hooded towel.   Cotton balls.   Bath basin.    Other Supplies   Rectal thermometer.   Bulb syringe.   Baby wipes or washcloths for diaper changes.   Diaper bag.   Changing pad.   Clothing, including one-piece outfits and pajamas.   Baby nail clippers.   Receiving blankets.   Mattress pad and sheets for the crib.   Night-light for the baby's room.   Baby monitor.   2 or 3 pacifiers.   Either 24-36 cloth diapers and waterproof diaper covers or a box of disposable diapers. You may need to use as many as 10-12 diapers per day.    How do I prepare for an emergency?  Prepare for an emergency by:   Knowing how to get to the nearest hospital.   Listing the phone numbers of your baby's health care providers near your home phone and in your cell phone.    How do I prepare my family?   Decide how to handle visitors.   If you have other children:  ? Talk with them about the baby coming home. Ask them how they feel about it.  ? Read a book together about  being a new big brother or sister.  ? Find ways to let them help you prepare for the new baby.  ? Have someone ready to care for them while you are in the hospital.  This information is not intended to replace advice given to you by your health care provider. Make sure you discuss any questions you have with your health care provider.  Document Released: 09/21/2008 Document Revised: 03/16/2016 Document Reviewed: 09/16/2014  Elsevier Interactive Patient Education  2018 Elsevier Inc[employer]

## 2018-02-20 ENCOUNTER — Ambulatory Visit (INDEPENDENT_AMBULATORY_CARE_PROVIDER_SITE_OTHER): Payer: Medicaid Other | Admitting: Student

## 2018-02-20 DIAGNOSIS — Z3480 Encounter for supervision of other normal pregnancy, unspecified trimester: Secondary | ICD-10-CM

## 2018-02-20 NOTE — Patient Instructions (Signed)
Vaginal Delivery Vaginal delivery means that you will give birth by pushing your baby out of your birth canal (vagina). A team of health care providers will help you before, during, and after vaginal delivery. Birth experiences are unique for every woman and every pregnancy, and birth experiences vary depending on where you choose to give birth. What should I do to prepare for my baby's birth? Before your baby is born, it is important to talk with your health care provider about:  Your labor and delivery preferences. These may include: ? Medicines that you may be given. ? How you will manage your pain. This might include non-medical pain relief techniques or injectable pain relief such as epidural analgesia. ? How you and your baby will be monitored during labor and delivery. ? Who may be in the labor and delivery room with you. ? Your feelings about surgical delivery of your baby (cesarean delivery, or C-section) if this becomes necessary. ? Your feelings about receiving donated blood through an IV tube (blood transfusion) if this becomes necessary.  Whether you are able: ? To take pictures or videos of the birth. ? To eat during labor and delivery. ? To move around, walk, or change positions during labor and delivery.  What to expect after your baby is born, such as: ? Whether delayed umbilical cord clamping and cutting is offered. ? Who will care for your baby right after birth. ? Medicines or tests that may be recommended for your baby. ? Whether breastfeeding is supported in your hospital or birth center. ? How long you will be in the hospital or birth center.  How any medical conditions you have may affect your baby or your labor and delivery experience.  To prepare for your baby's birth, you should also:  Attend all of your health care visits before delivery (prenatal visits) as recommended by your health care provider. This is important.  Prepare your home for your baby's  arrival. Make sure that you have: ? Diapers. ? Baby clothing. ? Feeding equipment. ? Safe sleeping arrangements for you and your baby.  Install a car seat in your vehicle. Have your car seat checked by a certified car seat installer to make sure that it is installed safely.  Think about who will help you with your new baby at home for at least the first several weeks after delivery.  What can I expect when I arrive at the birth center or hospital? Once you are in labor and have been admitted into the hospital or birth center, your health care provider may:  Review your pregnancy history and any concerns you have.  Insert an IV tube into one of your veins. This is used to give you fluids and medicines.  Check your blood pressure, pulse, temperature, and heart rate (vital signs).  Check whether your bag of water (amniotic sac) has broken (ruptured).  Talk with you about your birth plan and discuss pain control options.  Monitoring Your health care provider may monitor your contractions (uterine monitoring) and your baby's heart rate (fetal monitoring). You may need to be monitored:  Often, but not continuously (intermittently).  All the time or for long periods at a time (continuously). Continuous monitoring may be needed if: ? You are taking certain medicines, such as medicine to relieve pain or make your contractions stronger. ? You have pregnancy or labor complications.  Monitoring may be done by:  Placing a special stethoscope or a handheld monitoring device on your abdomen to   check your baby's heartbeat, and feeling your abdomen for contractions. This method of monitoring does not continuously record your baby's heartbeat or your contractions.  Placing monitors on your abdomen (external monitors) to record your baby's heartbeat and the frequency and length of contractions. You may not have to wear external monitors all the time.  Placing monitors inside of your uterus  (internal monitors) to record your baby's heartbeat and the frequency, length, and strength of your contractions. ? Your health care provider may use internal monitors if he or she needs more information about the strength of your contractions or your baby's heart rate. ? Internal monitors are put in place by passing a thin, flexible wire through your vagina and into your uterus. Depending on the type of monitor, it may remain in your uterus or on your baby's head until birth. ? Your health care provider will discuss the benefits and risks of internal monitoring with you and will ask for your permission before inserting the monitors.  Telemetry. This is a type of continuous monitoring that can be done with external or internal monitors. Instead of having to stay in bed, you are able to move around during telemetry. Ask your health care provider if telemetry is an option for you.  Physical exam Your health care provider may perform a physical exam. This may include:  Checking whether your baby is positioned: ? With the head toward your vagina (head-down). This is most common. ? With the head toward the top of your uterus (head-up or breech). If your baby is in a breech position, your health care provider may try to turn your baby to a head-down position so you can deliver vaginally. If it does not seem that your baby can be born vaginally, your provider may recommend surgery to deliver your baby. In rare cases, you may be able to deliver vaginally if your baby is head-up (breech delivery). ? Lying sideways (transverse). Babies that are lying sideways cannot be delivered vaginally.  Checking your cervix to determine: ? Whether it is thinning out (effacing). ? Whether it is opening up (dilating). ? How low your baby has moved into your birth canal.  What are the three stages of labor and delivery?  Normal labor and delivery is divided into the following three stages: Stage 1  Stage 1 is the  longest stage of labor, and it can last for hours or days. Stage 1 includes: ? Early labor. This is when contractions may be irregular, or regular and mild. Generally, early labor contractions are more than 10 minutes apart. ? Active labor. This is when contractions get longer, more regular, more frequent, and more intense. ? The transition phase. This is when contractions happen very close together, are very intense, and may last longer than during any other part of labor.  Contractions generally feel mild, infrequent, and irregular at first. They get stronger, more frequent (about every 2-3 minutes), and more regular as you progress from early labor through active labor and transition.  Many women progress through stage 1 naturally, but you may need help to continue making progress. If this happens, your health care provider may talk with you about: ? Rupturing your amniotic sac if it has not ruptured yet. ? Giving you medicine to help make your contractions stronger and more frequent.  Stage 1 ends when your cervix is completely dilated to 4 inches (10 cm) and completely effaced. This happens at the end of the transition phase. Stage 2  Once   your cervix is completely effaced and dilated to 4 inches (10 cm), you may start to feel an urge to push. It is common for the body to naturally take a rest before feeling the urge to push, especially if you received an epidural or certain other pain medicines. This rest period may last for up to 1-2 hours, depending on your unique labor experience.  During stage 2, contractions are generally less painful, because pushing helps relieve contraction pain. Instead of contraction pain, you may feel stretching and burning pain, especially when the widest part of your baby's head passes through the vaginal opening (crowning).  Your health care provider will closely monitor your pushing progress and your baby's progress through the vagina during stage 2.  Your  health care provider may massage the area of skin between your vaginal opening and anus (perineum) or apply warm compresses to your perineum. This helps it stretch as the baby's head starts to crown, which can help prevent perineal tearing. ? In some cases, an incision may be made in your perineum (episiotomy) to allow the baby to pass through the vaginal opening. An episiotomy helps to make the opening of the vagina larger to allow more room for the baby to fit through.  It is very important to breathe and focus so your health care provider can control the delivery of your baby's head. Your health care provider may have you decrease the intensity of your pushing, to help prevent perineal tearing.  After delivery of your baby's head, the shoulders and the rest of the body generally deliver very quickly and without difficulty.  Once your baby is delivered, the umbilical cord may be cut right away, or this may be delayed for 1-2 minutes, depending on your baby's health. This may vary among health care providers, hospitals, and birth centers.  If you and your baby are healthy enough, your baby may be placed on your chest or abdomen to help maintain the baby's temperature and to help you bond with each other. Some mothers and babies start breastfeeding at this time. Your health care team will dry your baby and help keep your baby warm during this time.  Your baby may need immediate care if he or she: ? Showed signs of distress during labor. ? Has a medical condition. ? Was born too early (prematurely). ? Had a bowel movement before birth (meconium). ? Shows signs of difficulty transitioning from being inside the uterus to being outside of the uterus. If you are planning to breastfeed, your health care team will help you begin a feeding. Stage 3  The third stage of labor starts immediately after the birth of your baby and ends after you deliver the placenta. The placenta is an organ that develops  during pregnancy to provide oxygen and nutrients to your baby in the womb.  Delivering the placenta may require some pushing, and you may have mild contractions. Breastfeeding can stimulate contractions to help you deliver the placenta.  After the placenta is delivered, your uterus should tighten (contract) and become firm. This helps to stop bleeding in your uterus. To help your uterus contract and to control bleeding, your health care provider may: ? Give you medicine by injection, through an IV tube, by mouth, or through your rectum (rectally). ? Massage your abdomen or perform a vaginal exam to remove any blood clots that are left in your uterus. ? Empty your bladder by placing a thin, flexible tube (catheter) into your bladder. ? Encourage   you to breastfeed your baby. After labor is over, you and your baby will be monitored closely to ensure that you are both healthy until you are ready to go home. Your health care team will teach you how to care for yourself and your baby. This information is not intended to replace advice given to you by your health care provider. Make sure you discuss any questions you have with your health care provider. Document Released: 07/18/2008 Document Revised: 04/28/2016 Document Reviewed: 10/24/2015 Elsevier Interactive Patient Education  2018 [employer]  

## 2018-02-21 NOTE — Progress Notes (Signed)
   PRENATAL VISIT NOTE  Subjective:  Deborah Sawyer is a 32 y.o. G2P1001 at [redacted]w[redacted]d being seen today for ongoing prenatal care.  She is currently monitored for the following issues for this low-risk pregnancy and has Supervision of other normal pregnancy, antepartum and Trichomoniasis on their problem list.  Patient reports occasional contractions.  Contractions: Irregular. Vag. Bleeding: None.  Movement: Present. Denies leaking of fluid.   The following portions of the patient's history were reviewed and updated as appropriate: allergies, current medications, past family history, past medical history, past social history, past surgical history and problem list. Problem list updated.  Objective:   Vitals:   02/20/18 1111  BP: 129/76  Pulse: 79    Fetal Status: Fetal Heart Rate (bpm): 133 Fundal Height: 38 cm Movement: Present  Presentation: Vertex  General:  Alert, oriented and cooperative. Patient is in no acute distress.  Skin: Skin is warm and dry. No rash noted.   Cardiovascular: Normal heart rate noted  Respiratory: Normal respiratory effort, no problems with respiration noted  Abdomen: Soft, gravid, appropriate for gestational age.  Pain/Pressure: Present     Pelvic: Cervical exam performed Dilation: Fingertip Effacement (%): 50 Station: -3  Extremities: Normal range of motion.  Edema: None  Mental Status: Normal mood and affect. Normal behavior. Normal judgment and thought content.   Assessment and Plan:  Pregnancy: G2P1001 at [redacted]w[redacted]d  1. Supervision of other normal pregnancy, antepartum -doing well  Term labor symptoms and general obstetric precautions including but not limited to vaginal bleeding, contractions, leaking of fluid and fetal movement were reviewed in detail with the patient. Please refer to After Visit Summary for other counseling recommendations.  Return in about 1 week (around 02/27/2018) for Routine OB.  Future Appointments  Date Time Provider Department  Center  02/27/2018 11:15 AM Kathlene Cote [facility]  03/06/2018  8:15 AM Leftwich-Kirby, Wilmer Floor, CNM [facility]    Judeth Horn, NP

## 2018-02-27 ENCOUNTER — Telehealth (HOSPITAL_COMMUNITY): Payer: Self-pay | Admitting: *Deleted

## 2018-02-27 ENCOUNTER — Encounter: Payer: Self-pay | Admitting: Medical

## 2018-02-27 ENCOUNTER — Encounter (HOSPITAL_COMMUNITY): Payer: Self-pay | Admitting: *Deleted

## 2018-02-27 ENCOUNTER — Ambulatory Visit (INDEPENDENT_AMBULATORY_CARE_PROVIDER_SITE_OTHER): Payer: Medicaid Other | Admitting: Medical

## 2018-02-27 VITALS — BP 121/64 | HR 79 | Wt 173.5 lb

## 2018-02-27 DIAGNOSIS — Z3480 Encounter for supervision of other normal pregnancy, unspecified trimester: Secondary | ICD-10-CM

## 2018-02-27 NOTE — Telephone Encounter (Signed)
Preadmission screen  

## 2018-02-27 NOTE — Progress Notes (Signed)
   PRENATAL VISIT NOTE  Subjective:  Deborah Sawyer is a 32 y.o. G2P1001 at [redacted]w[redacted]d being seen today for ongoing prenatal care.  She is currently monitored for the following issues for this low-risk pregnancy and has Supervision of other normal pregnancy, antepartum and Trichomoniasis on their problem list.  Patient reports no complaints.  Contractions: Irregular. Vag. Bleeding: None.  Movement: Present. Denies leaking of fluid.   The following portions of the patient's history were reviewed and updated as appropriate: allergies, current medications, past family history, past medical history, past social history, past surgical history and problem list. Problem list updated.  Objective:   Vitals:   02/27/18 1142  BP: 121/64  Pulse: 79  Weight: 173 lb 8 oz (78.7 kg)    Fetal Status: Fetal Heart Rate (bpm): 142 Fundal Height: 39 cm Movement: Present  Presentation: Vertex  General:  Alert, oriented and cooperative. Patient is in no acute distress.  Skin: Skin is warm and dry. No rash noted.   Cardiovascular: Normal heart rate noted  Respiratory: Normal respiratory effort, no problems with respiration noted  Abdomen: Soft, gravid, appropriate for gestational age.  Pain/Pressure: Present     Pelvic: Cervical exam performed Dilation: 1 Effacement (%): 50 Station: -3  Extremities: Normal range of motion.  Edema: Trace  Mental Status: Normal mood and affect. Normal behavior. Normal judgment and thought content.   Assessment and Plan:  Pregnancy: G2P1001 at [redacted]w[redacted]d  1. Supervision of other normal pregnancy, antepartum - IOL scheduled for 03/09/18 - Possible Foley Bulb at next visit  Term labor symptoms and general obstetric precautions including but not limited to vaginal bleeding, contractions, leaking of fluid and fetal movement were reviewed in detail with the patient. Please refer to After Visit Summary for other counseling recommendations.  Return in about 9 days (around 03/08/2018) for  LOB, NST/BPP for post dates and possible foley insertion.  Future Appointments  Date Time Provider Department Center  03/06/2018  8:15 AM Leftwich-Kirby, Wilmer Floor, CNM [facility]  03/09/2018  7:00 AM [facility] None    Vonzella Nipple, PA-C

## 2018-02-27 NOTE — Patient Instructions (Signed)

## 2018-03-05 ENCOUNTER — Encounter (HOSPITAL_COMMUNITY): Payer: Self-pay

## 2018-03-05 ENCOUNTER — Other Ambulatory Visit: Payer: Self-pay

## 2018-03-05 ENCOUNTER — Inpatient Hospital Stay (HOSPITAL_COMMUNITY)
Admission: AD | Admit: 2018-03-05 | Discharge: 2018-03-07 | DRG: 807 | Disposition: A | Discharge status: Home / Self Care | Payer: Medicaid Other | Source: Ambulatory Visit | Attending: Family Medicine | Admitting: Family Medicine

## 2018-03-05 DIAGNOSIS — O4290 Premature rupture of membranes, unspecified as to length of time between rupture and onset of labor, unspecified weeks of gestation: Secondary | ICD-10-CM | POA: Diagnosis present

## 2018-03-05 DIAGNOSIS — Z3A40 40 weeks gestation of pregnancy: Secondary | ICD-10-CM

## 2018-03-05 DIAGNOSIS — O4292 Full-term premature rupture of membranes, unspecified as to length of time between rupture and onset of labor: Secondary | ICD-10-CM | POA: Diagnosis present

## 2018-03-05 DIAGNOSIS — Z3480 Encounter for supervision of other normal pregnancy, unspecified trimester: Secondary | ICD-10-CM

## 2018-03-05 DIAGNOSIS — Z87891 Personal history of nicotine dependence: Secondary | ICD-10-CM | POA: Diagnosis not present

## 2018-03-05 DIAGNOSIS — A599 Trichomoniasis, unspecified: Secondary | ICD-10-CM | POA: Diagnosis present

## 2018-03-05 LAB — COMPREHENSIVE METABOLIC PANEL
ALBUMIN: 2.9 g/dL — AB (ref 3.5–5.0)
ALT: 13 U/L — ABNORMAL LOW (ref 14–54)
AST: 24 U/L (ref 15–41)
Alkaline Phosphatase: 141 U/L — ABNORMAL HIGH (ref 38–126)
Anion gap: 11 (ref 5–15)
BUN: 5 mg/dL — AB (ref 6–20)
CO2: 18 mmol/L — ABNORMAL LOW (ref 22–32)
Calcium: 8.8 mg/dL — ABNORMAL LOW (ref 8.9–10.3)
Chloride: 105 mmol/L (ref 101–111)
Creatinine, Ser: 0.62 mg/dL (ref 0.44–1.00)
GFR calc Af Amer: >60 mL/min (ref >60)
GFR calc non Af Amer: >60 mL/min (ref >60)
GLUCOSE: 99 mg/dL (ref 65–99)
POTASSIUM: 3.6 mmol/L (ref 3.5–5.1)
Sodium: 134 mmol/L — ABNORMAL LOW (ref 135–145)
Total Bilirubin: 0.3 mg/dL (ref 0.3–1.2)
Total Protein: 6.5 g/dL (ref 6.5–8.1)

## 2018-03-05 LAB — CBC
HCT: 33.1 % — ABNORMAL LOW (ref 36.0–46.0)
Hemoglobin: 10.8 g/dL — ABNORMAL LOW (ref 12.0–15.0)
MCH: 29.3 pg (ref 26.0–34.0)
MCHC: 32.6 g/dL (ref 30.0–36.0)
MCV: 89.9 fL (ref 78.0–100.0)
PLATELETS: 206 10*3/uL (ref 150–400)
RBC: 3.68 MIL/uL — ABNORMAL LOW (ref 3.87–5.11)
RDW: 15.3 % (ref 11.5–15.5)
WBC: 10.5 10*3/uL (ref 4.0–10.5)

## 2018-03-05 LAB — ABO/RH: ABO/RH(D): O POS

## 2018-03-05 LAB — TYPE AND SCREEN
ABO/RH(D): O POS
Antibody Screen: NEGATIVE

## 2018-03-05 LAB — POCT FERN TEST: POCT FERN TEST: POSITIVE

## 2018-03-05 MED ORDER — OXYCODONE-ACETAMINOPHEN 5-325 MG PO TABS: 2.0000 | ORAL_TABLET | ORAL | Status: DC | PRN

## 2018-03-05 MED ORDER — SOD CITRATE-CITRIC ACID 500-334 MG/5ML PO SOLN: 30.0000 mL | ORAL | Status: DC | PRN

## 2018-03-05 MED ORDER — FENTANYL CITRATE (PF) 100 MCG/2ML IJ SOLN
100.0000 ug | INTRAMUSCULAR | Status: DC | PRN
  Administered 2018-03-05 (×2): 100 ug via INTRAVENOUS
  Filled 2018-03-05 (×2): qty 2

## 2018-03-05 MED ORDER — PRENATAL MULTIVITAMIN CH
1.0000 | ORAL_TABLET | Freq: Every day | ORAL | Status: DC
  Administered 2018-03-06 – 2018-03-07 (×2): 1 via ORAL
  Filled 2018-03-05 (×2): qty 1

## 2018-03-05 MED ORDER — ACETAMINOPHEN 325 MG PO TABS: 650.0000 mg | ORAL_TABLET | ORAL | Status: DC | PRN

## 2018-03-05 MED ORDER — TETANUS-DIPHTH-ACELL PERTUSSIS 5-2.5-18.5 LF-MCG/0.5 IM SUSP: 0.5000 mL | Freq: Once | INTRAMUSCULAR | Status: DC

## 2018-03-05 MED ORDER — ONDANSETRON HCL 4 MG/2ML IJ SOLN: 4.0000 mg | Freq: Four times a day (QID) | INTRAMUSCULAR | Status: DC | PRN

## 2018-03-05 MED ORDER — BENZOCAINE-MENTHOL 20-0.5 % EX AERO
1.0000 "application " | INHALATION_SPRAY | CUTANEOUS | Status: DC | PRN
  Administered 2018-03-06: 1 via TOPICAL
  Filled 2018-03-05: qty 56

## 2018-03-05 MED ORDER — SIMETHICONE 80 MG PO CHEW: 80.0000 mg | CHEWABLE_TABLET | ORAL | Status: DC | PRN

## 2018-03-05 MED ORDER — LACTATED RINGERS IV SOLN
INTRAVENOUS | Status: DC
  Administered 2018-03-05: 13:00:00 via INTRAVENOUS

## 2018-03-05 MED ORDER — SENNOSIDES-DOCUSATE SODIUM 8.6-50 MG PO TABS
2.0000 | ORAL_TABLET | ORAL | Status: DC
  Administered 2018-03-06: 2 via ORAL
  Filled 2018-03-05 (×2): qty 2

## 2018-03-05 MED ORDER — OXYTOCIN 40 UNITS IN LACTATED RINGERS INFUSION - SIMPLE MED
2.5000 [IU]/h | INTRAVENOUS | Status: DC
  Filled 2018-03-05: qty 1000

## 2018-03-05 MED ORDER — TERBUTALINE SULFATE 1 MG/ML IJ SOLN
0.2500 mg | Freq: Once | INTRAMUSCULAR | Status: DC | PRN
  Filled 2018-03-05: qty 1

## 2018-03-05 MED ORDER — ONDANSETRON HCL 4 MG PO TABS: 4.0000 mg | ORAL_TABLET | ORAL | Status: DC | PRN

## 2018-03-05 MED ORDER — COCONUT OIL OIL: 1.0000 "application " | TOPICAL_OIL | Status: DC | PRN

## 2018-03-05 MED ORDER — LIDOCAINE HCL (PF) 1 % IJ SOLN
30.0000 mL | INTRAMUSCULAR | Status: DC | PRN
  Administered 2018-03-05: 30 mL via SUBCUTANEOUS
  Filled 2018-03-05: qty 30

## 2018-03-05 MED ORDER — DIBUCAINE 1 % RE OINT: 1.0000 "application " | TOPICAL_OINTMENT | RECTAL | Status: DC | PRN

## 2018-03-05 MED ORDER — LACTATED RINGERS IV SOLN: 500.0000 mL | INTRAVENOUS | Status: DC | PRN

## 2018-03-05 MED ORDER — OXYTOCIN BOLUS FROM INFUSION
500.0000 mL | Freq: Once | INTRAVENOUS | Status: AC
  Administered 2018-03-05: 500 mL via INTRAVENOUS

## 2018-03-05 MED ORDER — ZOLPIDEM TARTRATE 5 MG PO TABS: 5.0000 mg | ORAL_TABLET | Freq: Every evening | ORAL | Status: DC | PRN

## 2018-03-05 MED ORDER — IBUPROFEN 600 MG PO TABS
600.0000 mg | ORAL_TABLET | Freq: Four times a day (QID) | ORAL | Status: DC
  Administered 2018-03-05 – 2018-03-07 (×7): 600 mg via ORAL
  Filled 2018-03-05 (×7): qty 1

## 2018-03-05 MED ORDER — DIPHENHYDRAMINE HCL 25 MG PO CAPS: 25.0000 mg | ORAL_CAPSULE | Freq: Four times a day (QID) | ORAL | Status: DC | PRN

## 2018-03-05 MED ORDER — OXYTOCIN 40 UNITS IN LACTATED RINGERS INFUSION - SIMPLE MED
1.0000 m[IU]/min | INTRAVENOUS | Status: DC
  Administered 2018-03-05: 2 m[IU]/min via INTRAVENOUS

## 2018-03-05 MED ORDER — ONDANSETRON HCL 4 MG/2ML IJ SOLN: 4.0000 mg | INTRAMUSCULAR | Status: DC | PRN

## 2018-03-05 MED ORDER — OXYCODONE-ACETAMINOPHEN 5-325 MG PO TABS: 1.0000 | ORAL_TABLET | ORAL | Status: DC | PRN

## 2018-03-05 MED ORDER — WITCH HAZEL-GLYCERIN EX PADS: 1.0000 "application " | MEDICATED_PAD | CUTANEOUS | Status: DC | PRN

## 2018-03-05 NOTE — MAU Note (Signed)
Pt here reporting contractions since 4am coming every 2-33min. +FM. Denied VB. Reports LOF that is clear no odor with contractions.

## 2018-03-05 NOTE — Progress Notes (Signed)
LABOR PROGRESS NOTE  Deborah Sawyer is a 32 y.o. G2P1001 at [redacted]w[redacted]d  admitted for IOL for PROM  Subjective: Patient feeling strong contractions. Does not want epidural  Objective: BP 125/76   Pulse 74   Temp 97.6 F (36.4 C) (Axillary)   Resp 20   Ht  (1.575 m)   Wt 172 lb (78 kg)   LMP 05/26/2017 (Exact Date)   SpO2 100%   BMI 31.46 kg/m  or  Vitals:   03/05/18 1604 03/05/18 1639 03/05/18 1710 03/05/18 1750  BP: (!) 143/84 (!) 141/82 129/85 125/76  Pulse: 72 73 69 74  Resp:   20   Temp:   97.6 F (36.4 C)   TempSrc:   Axillary   SpO2:      Weight:      Height:        SVE: 7-8cm/80%/-2  FHT: baseline rate 120, moderate varibility, +acel, ocasional variable decel Toco: ctx q 1-3 min   Assessment / Plan: 32 y.o. G2P1001 at [redacted]w[redacted]d here for IOL for PROM  Labor: Has progressed well on Pitocin. Ruptured forebag, mec noted. Continue current management Fetal Wellbeing:  Cat II Pain Control:  Per patient's request; not planning on getting epidural Anticipated MOD:  SVD  Frederik Pear, MD 03/05/2018, 6:51 PM

## 2018-03-05 NOTE — H&P (Addendum)
OBSTETRIC ADMISSION HISTORY AND PHYSICAL  Deborah Sawyer is a 32 y.o. female G2P1001 with IUP at [redacted]w[redacted]d by LMP and 13wk [place] presenting for SROM at 0400 and contractions. She reports +FMs, No LOF, no VB, no blurry vision, headaches or peripheral edema, and RUQ pain.  She plans on breast feeding. She is unsure about birth control.  Prenatal History/Complications: She received her prenatal care at [facility] Dating: By LMP and [place] --->  Estimated Date of Delivery: 03/02/18 Sono:   , CWD, normal anatomy, breech presentation, anterior placenta, 264g, 36% EFW Complications: - Trichomonas this pregnancy, treats w/ negative TOC  Past Medical History: Past Medical History:  Diagnosis Date  . Hernia of abdominal cavity age 59  . Hx of lumpectomy age 33    Past Surgical History: Past Surgical History:  Procedure Laterality Date  . HERNIA REPAIR      Obstetrical History: OB History    Gravida  2   Para  1   Term  1   Preterm      AB      Living  1     SAB      TAB      Ectopic      Multiple      Live Births  1           Social History: Social History   Socioeconomic History  . Marital status: Single    Spouse name: Not on file  . Number of children: Not on file  . Years of education: Not on file  . Highest education level: Not on file  Occupational History  . Not on file  Social Needs  . Financial resource strain: Not on file  . Food insecurity:    Worry: Not on file    Inability: Not on file  . Transportation needs:    Medical: Not on file    Non-medical: Not on file  Tobacco Use  . Smoking status: Former Smoker    Types: Cigarettes    Last attempt to quit: 06/23/2017    Years since quitting: 0.6  . Smokeless tobacco: Never Used  Substance and Sexual Activity  . Alcohol use: No  . Drug use: No  . Sexual activity: Yes    Birth control/protection: None  Lifestyle  . Physical activity:    Days per week: Not on file    Minutes per session: Not on file   . Stress: Not on file  Relationships  . Social connections:    Talks on phone: Not on file    Gets together: Not on file    Attends religious service: Not on file    Active member of club or organization: Not on file    Attends meetings of clubs or organizations: Not on file    Relationship status: Not on file  Other Topics Concern  . Not on file  Social History Narrative  . Not on file    Family History: History reviewed. No pertinent family history.  Allergies: No Known Allergies  Medications Prior to Admission  Medication Sig Dispense Refill Last Dose  . acetaminophen (TYLENOL) 325 MG tablet Take 650 mg by mouth every 6 (six) hours as needed for headache (pain).   03/04/2018 at Unknown time  . Prenatal Multivit-Min-Fe-FA (PRENATAL VITAMINS PO) Take 1 tablet by mouth daily.    03/04/2018 at Unknown time     Review of Systems   All systems reviewed and negative except as stated in HPI  Blood pressure 128/81, pulse 90, temperature 97.8 F (36.6 C), temperature source Oral, resp. rate 17, height  (1.575 m), weight 78 kg (172 lb), last menstrual period 05/26/2017, SpO2 100 %. General appearance: alert and cooperative Lungs: clear to auscultation bilaterally Heart: regular rate and rhythm Abdomen: soft, non-tender; bowel sounds normal Extremities: Homans sign is negative, no sign of DVT Presentation: cephalic Fetal monitoringBaseline: 130 bpm, Variability: Good {> 6 bpm), Accelerations: Reactive and Decelerations: Absent Uterine activityFrequency: Every 4-5 minutes Dilation: 4 Effacement (%): 90 Station: -2 Exam by:: Dorrene German RN   Prenatal labs: ABO, Rh: O/Positive/-- (10/25 1022) Antibody: Negative (10/25 1022) Rubella: 6.99 (10/25 1022) RPR: Non Reactive (02/21 1122)  HBsAg: Negative (10/25 1022)  HIV: Non Reactive (02/21 1122)  GBS:   neg Glucose: 1 hour/fasting/2 hour-> 144/84/89 Genetic screening  normal Anatomy US normal  Prenatal Transfer Tool   Maternal Diabetes: No Genetic Screening: Normal Maternal Ultrasounds/Referrals: Normal Fetal Ultrasounds or other Referrals:  None Maternal Substance Abuse:  No Significant Maternal Medications:  None Significant Maternal Lab Results: None  Results for orders placed or performed during the hospital encounter of 03/05/18 (from the past 24 hour(s))  POCT fern test   Collection Time: 03/05/18 12:32 PM  Result Value Ref Range   POCT Fern Test Positive = ruptured amniotic membanes     Patient Active Problem List   Diagnosis Date Noted  . Trichomoniasis 08/20/2017  . Supervision of other normal pregnancy, antepartum 08/16/2017    Assessment/Plan:  Deborah Sawyer is a 32 y.o. G2P1001 at [redacted]w[redacted]d here for contractions and SROM. SROM occurred about 0400 on 5/14. Prenatal care routine without any abnormalities. Will manage expectantly for now, can start pitocin if patient's progress slows down. GBS negative.   #Labor: start Pitocin #Pain: Analgesia prn #FWB: Cat 1 #ID:  GBS neg #MOF: breast #MOC: undecided #Circ:  no  Myrene Buddy, MD  03/05/2018, 12:58 PM   OB FELLOW HISTORY AND PHYSICAL ATTESTATION I have seen and examined this patient; I agree with above documentation in the resident's note.   32 y.o. G2P1001 with IUP at [redacted]w[redacted]d presenting for PROM at 0400 today. She is contracting infrequently and not in active labor, now 11 hours s/p ROM. - Start IV Pitocin for IOL - GBS neg, abx not indicated at this time - FHT Cat I  Frederik Pear, MD OB Fellow 03/05/2018, 2:59 PM

## 2018-03-05 NOTE — Anesthesia Pain Management Evaluation Note (Signed)
  CRNA Pain Management Visit Note  Patient: Deborah Sawyer, 32 y.o., female  "Hello I am a member of the anesthesia team at [facility]. We have an anesthesia team available at all times to provide care throughout the hospital, including epidural management and anesthesia for C-section. I don't know your plan for the delivery whether it a natural birth, water birth, IV sedation, nitrous supplementation, doula or epidural, but we want to meet your pain goals."   1.Was your pain managed to your expectations on prior hospitalizations?   Yes   2.What is your expectation for pain management during this hospitalization?     IV pain meds  3.How can we help you reach that goal? Nursing support  Record the patient's initial score and the patient's pain goal.   Pain: 8/10  Pain Goal: 5/10 [facility] wants you to be able to say your pain was always managed very well.  Salome Arnt 03/05/2018

## 2018-03-05 NOTE — MAU Note (Signed)
Pt reports contractions 

## 2018-03-06 ENCOUNTER — Other Ambulatory Visit: Payer: Medicaid Other

## 2018-03-06 ENCOUNTER — Encounter: Payer: Medicaid Other | Admitting: Obstetrics & Gynecology

## 2018-03-06 LAB — RPR: RPR: NONREACTIVE

## 2018-03-06 NOTE — Lactation Note (Signed)
This note was copied from a baby's chart. Lactation Consultation Note Baby 9 hrs old. Mom states baby is sleepy. Mom holding baby STS. Praised mom. Discussed newborn behavior and feeding habits. Encouraged to wake baby if hasn't cued to feed before 3 hrs. Mom encouraged to feed baby 8-12 times/24 hours and with feeding cues.  Discussed STS, I&O, cluster feeding, hand expression, supply and demand.  Mom has a 34 yr old that she BF for 3 months. Mom plans to BF for 3 months then will breast and formula feed. Discussed pumping, storing milk and giving BM in bottles. Mom has very short shaft compressible everted nipples. Hand expressed colostrum to tip of nipple.  Hand pump and shells given.  Mom asked LC to swaddle baby and place in crib so she could go to BR. Encouraged mom to void before BF d/t abd. Cramping.  Encouraged mom to call for assistance or questions. WH/LC brochure given w/resources, support groups and LC services.  Patient Name: Deborah Sawyer'B Date: 03/06/2018 Reason for consult: Initial assessment   Maternal Data Has patient been taught Hand Expression?: Yes Does the patient have breastfeeding experience prior to this delivery?: Yes  Feeding Feeding Type: Breast Fed  LATCH Score Latch: Too sleepy or reluctant, no latch achieved, no sucking elicited.  Audible Swallowing: A few with stimulation  Type of Nipple: Everted at rest and after stimulation  Comfort (Breast/Nipple): Soft / non-tender  Hold (Positioning): Assistance needed to correctly position infant at breast and maintain latch.  LATCH Score: 7  Interventions Interventions: Breast feeding basics reviewed;Skin to skin;Position options;Breast massage;Hand express;Breast compression  Lactation Tools Discussed/Used WIC Program: No   Consult Status Consult Status: Follow-up Date: 03/07/18 Follow-up type: In-patient    Sasha Rueth, Diamond Nickel 03/06/2018, 4:30 AM

## 2018-03-06 NOTE — Plan of Care (Signed)
Progressing appropriately. Safety information and orientation to room discussed. Encouraged to call for assistance as needed and for [facility] assessment.

## 2018-03-06 NOTE — Progress Notes (Addendum)
POSTPARTUM PROGRESS NOTE  Post Partum Day 1  Subjective:  Deborah Sawyer is a 32 y.o. [id number] s/p SVD  at [redacted]w[redacted]d.  She reports she is doing well. No acute events overnight. She denies any problems with ambulating, voiding or po intake. Denies nausea or vomiting.  Pain is well controlled.  Lochia is nml. +mild cramping with breastfeeding and decreased bleeding.  No clots.    Objective: Blood pressure 95/75, pulse 82, temperature 98 F (36.7 C), temperature source Oral, resp. rate 18, height  (1.575 m), weight 78 kg (172 lb), last menstrual period 05/26/2017, SpO2 100 %, unknown if currently breastfeeding.  Physical Exam:  General: alert, cooperative and no distress Chest: no respiratory distress Heart:regular rate, distal pulses intact Abdomen: soft, nontender,  Uterine Fundus: firm, appropriately tender DVT Evaluation: No calf swelling or tenderness Extremities: absent edema Skin: warm, dry  Recent Labs    03/05/18 1313  HGB 10.8*  HCT 33.1*    Assessment/Plan: Deborah Sawyer is a 32 y.o. [id number] s/p SVD at [redacted]w[redacted]d   PPD#1 - Doing well  Routine postpartum care Contraception: undecided Feeding: Breastfeeding Dispo: Plan for discharge 03/07/2018   LOS: 1 day   Deborah Zealand T NguyenMD 03/06/2018, 8:14 AM   OB FELLOW MEDICAL STUDENT NOTE ATTESTATION  I confirm that I have verified the information documented in the medical student's note and that I have also personally performed the physical exam and all medical decision making activities.  Patient doing well PPD#1. Plan to d/c tomorrow Exam wnl, VS stable  Frederik Pear, MD OB Fellow 03/06/2018, 9:25 AM

## 2018-03-07 LAB — BIRTH TISSUE RECOVERY COLLECTION (PLACENTA DONATION)

## 2018-03-07 MED ORDER — IBUPROFEN 600 MG PO TABS: 600.0000 mg | ORAL_TABLET | Freq: Four times a day (QID) | ORAL | 0 refills | Status: DC | PRN

## 2018-03-07 NOTE — Discharge Instructions (Signed)
Vaginal Delivery, Care After °Refer to this sheet in the next few weeks. These instructions provide you with information about caring for yourself after vaginal delivery. Your health care provider may also give you more specific instructions. Your treatment has been planned according to current medical practices, but problems sometimes occur. Call your health care provider if you have any problems or questions. °What can I expect after the procedure? °After vaginal delivery, it is common to have: °· Some bleeding from your vagina. °· Soreness in your abdomen, your vagina, and the area of skin between your vaginal opening and your anus (perineum). °· Pelvic cramps. °· Fatigue. ° °Follow these instructions at home: °Medicines °· Take over-the-counter and prescription medicines only as told by your health care provider. °· If you were prescribed an antibiotic medicine, take it as told by your health care provider. Do not stop taking the antibiotic until it is finished. °Driving ° °· Do not drive or operate heavy machinery while taking prescription pain medicine. °· Do not drive for 24 hours if you received a sedative. °Lifestyle °· Do not drink alcohol. This is especially important if you are breastfeeding or taking medicine to relieve pain. °· Do not use tobacco products, including cigarettes, chewing tobacco, or e-cigarettes. If you need help quitting, ask your health care provider. °Eating and drinking °· Drink at least 8 eight-ounce glasses of water every day unless you are told not to by your health care provider. If you choose to breastfeed your baby, you may need to drink more water than this. °· Eat high-fiber foods every day. These foods may help prevent or relieve constipation. High-fiber foods include: °? Whole grain cereals and breads. °? Brown rice. °? Beans. °? Fresh fruits and vegetables. °Activity °· Return to your normal activities as told by your health care provider. Ask your health care provider  what activities are safe for you. °· Rest as much as possible. Try to rest or take a nap when your baby is sleeping. °· Do not lift anything that is heavier than your baby or 10 lb (4.5 kg) until your health care provider says that it is safe. °· Talk with your health care provider about when you can engage in sexual activity. This may depend on your: °? Risk of infection. °? Rate of healing. °? Comfort and desire to engage in sexual activity. °Vaginal Care °· If you have an episiotomy or a vaginal tear, check the area every day for signs of infection. Check for: °? More redness, swelling, or pain. °? More fluid or blood. °? Warmth. °? Pus or a bad smell. °· Do not use tampons or douches until your health care provider says this is safe. °· Watch for any blood clots that may pass from your vagina. These may look like clumps of dark red, brown, or black discharge. °General instructions °· Keep your perineum clean and dry as told by your health care provider. °· Wear loose, comfortable clothing. °· Wipe from front to back when you use the toilet. °· Ask your health care provider if you can shower or take a bath. If you had an episiotomy or a perineal tear during labor and delivery, your health care provider may tell you not to take baths for a certain length of time. °· Wear a bra that supports your breasts and fits you well. °· If possible, have someone help you with household activities and help care for your baby for at least a few days after   you leave the hospital. °· Keep all follow-up visits for you and your baby as told by your health care provider. This is important. °Contact a health care provider if: °· You have: °? Vaginal discharge that has a bad smell. °? Difficulty urinating. °? Pain when urinating. °? A sudden increase or decrease in the frequency of your bowel movements. °? More redness, swelling, or pain around your episiotomy or vaginal tear. °? More fluid or blood coming from your episiotomy or  vaginal tear. °? Pus or a bad smell coming from your episiotomy or vaginal tear. °? A fever. °? A rash. °? Little or no interest in activities you used to enjoy. °? Questions about caring for yourself or your baby. °· Your episiotomy or vaginal tear feels warm to the touch. °· Your episiotomy or vaginal tear is separating or does not appear to be healing. °· Your breasts are painful, hard, or turn red. °· You feel unusually sad or worried. °· You feel nauseous or you vomit. °· You pass large blood clots from your vagina. If you pass a blood clot from your vagina, save it to show to your health care provider. Do not flush blood clots down the toilet without having your health care provider look at them. °· You urinate more than usual. °· You are dizzy or light-headed. °· You have not breastfed at all and you have not had a menstrual period for 12 weeks after delivery. °· You have stopped breastfeeding and you have not had a menstrual period for 12 weeks after you stopped breastfeeding. °Get help right away if: °· You have: °? Pain that does not go away or does not get better with medicine. °? Chest pain. °? Difficulty breathing. °? Blurred vision or spots in your vision. °? Thoughts about hurting yourself or your baby. °· You develop pain in your abdomen or in one of your legs. °· You develop a severe headache. °· You faint. °· You bleed from your vagina so much that you fill two sanitary pads in one hour. °This information is not intended to replace advice given to you by your health care provider. Make sure you discuss any questions you have with your health care provider. °Document Released: 10/06/2000 Document Revised: 03/22/2016 Document Reviewed: 10/24/2015 °Elsevier Interactive Patient Education © 2018 [employer] ° °

## 2018-03-07 NOTE — Lactation Note (Signed)
This note was copied from a baby's chart. Lactation Consultation Note  Patient Name: Deborah Sawyer RUEAV'W Date: 03/07/2018  Mom reports feedings are going well.  She sees improvement with each day.  Discussed milk coming to volume and engorgement prevention and treatment.  Lactation outpatient services and support information reviewed and encouraged prn.   Maternal Data    Feeding Feeding Type: Breast Fed Length of feed: 10 min  LATCH Score Latch: Grasps breast easily, tongue down, lips flanged, rhythmical sucking.  Audible Swallowing: A few with stimulation  Type of Nipple: Everted at rest and after stimulation  Comfort (Breast/Nipple): Soft / non-tender  Hold (Positioning): No assistance needed to correctly position infant at breast.  LATCH Score: 9  Interventions    Lactation Tools Discussed/Used     Consult Status      Huston Foley 03/07/2018, 10:09 AM

## 2018-03-07 NOTE — Discharge Summary (Signed)
OB Discharge Summary     Patient Name: Deborah Sawyer DOB: 1986-05-25 MRN: [id number]  Date of admission: 03/05/2018 Delivering MD: Frederik Pear   Date of discharge: 03/07/2018  Admitting diagnosis: 40WKS LABOR  Intrauterine pregnancy: [redacted]w[redacted]d     Secondary diagnosis:  Principal Problem:   PROM (premature rupture of membranes) Active Problems:   Supervision of other normal pregnancy, antepartum   Trichomoniasis   SVD (spontaneous vaginal delivery)  Additional problems: GBS neg     Discharge diagnosis: Term Pregnancy Delivered                                                                                                Post partum procedures:none  Augmentation: Pitocin, and AROM of forebag  Complications: None  Hospital course:  Induction of Labor With Vaginal Delivery   32 y.o. yo G2P2002 at [redacted]w[redacted]d was admitted to the hospital 03/05/2018 for induction of labor.  Indication for induction: PROM.  Patient had an uncomplicated labor course as follows: Membrane Rupture Time/Date: 4:00 AM ,03/05/2018   Intrapartum Procedures: Episiotomy: None [1]                                         Lacerations:  1st degree [2]  Patient had delivery of a Viable infant.  Information for the patient's newborn:  Cortasia, Screws [[id number]]  Delivery Method: Vaginal, Spontaneous(Filed from Delivery Summary)   03/05/2018  Details of delivery can be found in separate delivery note.  Patient had a routine postpartum course. Patient is discharged home 03/07/18.  Physical exam  Vitals:   03/06/18 0220 03/06/18 0905 03/06/18 1757 03/07/18 0608  BP: 95/75 110/83 115/80 111/60  Pulse: 82 72 76 68  Resp: Temp: 98 F (36.7 C) 98.6 F (37 C) 98.5 F (36.9 C) 99.3 F (37.4 C)  TempSrc: Oral Oral Oral Oral  SpO2:  99%  100%  Weight:      Height:       General: alert and cooperative Lochia: appropriate Uterine Fundus: firm Incision: N/A DVT Evaluation: No evidence of DVT seen on  physical exam. Labs: Lab Results  Component Value Date   WBC 10.5 03/05/2018   HGB 10.8 (L) 03/05/2018   HCT 33.1 (L) 03/05/2018   MCV 89.9 03/05/2018   PLT 206 03/05/2018   CMP Latest Ref Rng & Units 03/05/2018  Glucose 65 - 99 mg/dL 99  BUN 6 - 20 mg/dL 5(L)  Creatinine 0.10 - 1.00 mg/dL 2.72  Sodium 536 - 644 mmol/L 134(L)  Potassium 3.5 - 5.1 mmol/L 3.6  Chloride 101 - 111 mmol/L 105  CO2 22 - 32 mmol/L 18(L)  Calcium 8.9 - 10.3 mg/dL 0.3(K)  Total Protein 6.5 - 8.1 g/dL 6.5  Total Bilirubin 0.3 - 1.2 mg/dL 0.3  Alkaline Phos 38 - 126 U/L 141(H)  AST 15 - 41 U/L 24  ALT 14 - 54 U/L 13(L)    Discharge instruction: per After Visit Summary and "Baby  and Me Booklet".  After visit meds:  Allergies as of 03/07/2018   No Known Allergies     Medication List    STOP taking these medications   acetaminophen 325 MG tablet Commonly known as:  TYLENOL     TAKE these medications   ibuprofen 600 MG tablet Commonly known as:  ADVIL,MOTRIN Take 1 tablet (600 mg total) by mouth every 6 (six) hours as needed.   PRENATAL VITAMINS PO Take 1 tablet by mouth daily.       Diet: routine diet  Activity: Advance as tolerated. Pelvic rest for 6 weeks.   Outpatient follow up:4 weeks Follow up Appt: Future Appointments  Date Time Provider Department Center  04/17/2018 10:35 AM Judeth Horn, NP [facility]   Follow up Visit:No follow-ups on file.  Postpartum contraception: Undecided  Newborn Data: Live born female  Birth Weight: 6 lb 15.1 oz (3150 g) APGAR: 9, 9  Newborn Delivery   Birth date/time:  03/05/2018 19:18:00 Delivery type:  Vaginal, Spontaneous     Baby Feeding: Breast Disposition:home with mother   03/07/2018 Cam Hai, CNM  9:02 AM

## 2018-03-09 ENCOUNTER — Inpatient Hospital Stay (HOSPITAL_COMMUNITY): Admission: RE | Admit: 2018-03-09 | Payer: Medicaid Other | Source: Ambulatory Visit

## 2018-03-13 ENCOUNTER — Encounter: Payer: Self-pay | Admitting: *Deleted

## 2018-04-17 ENCOUNTER — Encounter: Payer: Self-pay | Admitting: Student

## 2018-04-17 ENCOUNTER — Ambulatory Visit (INDEPENDENT_AMBULATORY_CARE_PROVIDER_SITE_OTHER): Payer: Medicaid Other | Admitting: Student

## 2018-04-17 DIAGNOSIS — Z1389 Encounter for screening for other disorder: Secondary | ICD-10-CM | POA: Diagnosis not present

## 2018-04-17 NOTE — Progress Notes (Signed)
..  Post Partum Exam  Deborah Sawyer is a 32 y.o. 672P2002 female who presents for a postpartum visit. She is 6 weeks postpartum following a spontaneous vaginal delivery. I have fully reviewed the prenatal and intrapartum course. The delivery was at 40 gestational weeks.  Anesthesia: IV pain medication. Postpartum course has been unremarkable. Baby's course has been unremarkable. Baby is feeding by breast. Bleeding no bleeding. Bowel function is normal. Bladder function is normal. Patient is sexually active. Contraception method is condoms. Postpartum depression screening:neg  The following portions of the patient's history were reviewed and updated as appropriate: allergies, current medications, past family history, past medical history, past social history, past surgical history and problem list. Last pap smear done 2018 and was Normal  Review of Systems Pertinent items are noted in HPI.    Objective:  Blood pressure 136/88, pulse 73, resp. rate 16, weight 149 lb (67.6 kg), currently breastfeeding.  General:  alert, cooperative and appears stated age  Lungs: clear to auscultation bilaterally  Heart:  regular rate and rhythm, S1, S2 normal, no murmur, click, rub or gallop  Abdomen: soft, non-tender; bowel sounds normal; no masses,  no organomegaly        Assessment:    Normal postpartum exam. Pap smear not done at today's visit.   Plan:  1. Encounter for routine postpartum follow-up -Doing well -using condoms for contraception -Encouraged to establish care with PCP -return to us for contraceptive needs or routine gyn care  Judeth HornLawrence, Hadyn Blanck, NP

## 2018-04-17 NOTE — Patient Instructions (Signed)

## 2018-06-07 ENCOUNTER — Emergency Department (HOSPITAL_COMMUNITY)
Admission: EM | Admit: 2018-06-07 | Discharge: 2018-06-07 | Disposition: A | Discharge status: Home / Self Care | Payer: Medicaid Other | Attending: Emergency Medicine | Admitting: Emergency Medicine

## 2018-06-07 ENCOUNTER — Emergency Department (HOSPITAL_COMMUNITY): Payer: Medicaid Other

## 2018-06-07 ENCOUNTER — Encounter (HOSPITAL_COMMUNITY): Payer: Self-pay | Admitting: Emergency Medicine

## 2018-06-07 ENCOUNTER — Other Ambulatory Visit: Payer: Self-pay

## 2018-06-07 DIAGNOSIS — W1842XA Slipping, tripping and stumbling without falling due to stepping into hole or opening, initial encounter: Secondary | ICD-10-CM | POA: Diagnosis not present

## 2018-06-07 DIAGNOSIS — Y9301 Activity, walking, marching and hiking: Secondary | ICD-10-CM | POA: Insufficient documentation

## 2018-06-07 DIAGNOSIS — Y929 Unspecified place or not applicable: Secondary | ICD-10-CM | POA: Insufficient documentation

## 2018-06-07 DIAGNOSIS — Y999 Unspecified external cause status: Secondary | ICD-10-CM | POA: Insufficient documentation

## 2018-06-07 DIAGNOSIS — S82892A Other fracture of left lower leg, initial encounter for closed fracture: Secondary | ICD-10-CM

## 2018-06-07 DIAGNOSIS — Z87891 Personal history of nicotine dependence: Secondary | ICD-10-CM | POA: Insufficient documentation

## 2018-06-07 DIAGNOSIS — Q7292 Unspecified reduction defect of left lower limb: Secondary | ICD-10-CM

## 2018-06-07 DIAGNOSIS — S8262XA Displaced fracture of lateral malleolus of left fibula, initial encounter for closed fracture: Secondary | ICD-10-CM | POA: Diagnosis not present

## 2018-06-07 DIAGNOSIS — S99912A Unspecified injury of left ankle, initial encounter: Secondary | ICD-10-CM | POA: Diagnosis present

## 2018-06-07 MED ORDER — MORPHINE SULFATE 15 MG PO TABS: 15.0000 mg | ORAL_TABLET | ORAL | 0 refills | Status: DC | PRN

## 2018-06-07 MED ORDER — FENTANYL CITRATE (PF) 100 MCG/2ML IJ SOLN
100.0000 ug | Freq: Once | INTRAMUSCULAR | Status: AC
  Administered 2018-06-07: 100 ug via INTRAVENOUS
  Filled 2018-06-07: qty 2

## 2018-06-07 MED ORDER — LIDOCAINE-EPINEPHRINE (PF) 2 %-1:200000 IJ SOLN
20.0000 mL | Freq: Once | INTRAMUSCULAR | Status: AC
  Administered 2018-06-07: 20 mL
  Filled 2018-06-07: qty 20

## 2018-06-07 NOTE — ED Provider Notes (Signed)
[facility] EMERGENCY DEPARTMENT Provider Note   CSN: [id number] Arrival date & time: 06/07/18  0418     History   Chief Complaint Chief Complaint  Patient presents with  . Ankle Injury    HPI Deborah Sawyer is a 32 y.o. female.  32 yo F with a chief complaint of left ankle pain.  The patient stepped into a hole and felt her ankle everted.  Her boyfriend sought and try to move it back in the place.  She had some mild pain but had done some cocaine and so did not have any real significant tenderness.  She denies any other injury.  The history is provided by the patient.  Ankle Injury  This is a new problem. The current episode started 1 to 2 hours ago. The problem occurs constantly. The problem has not changed since onset.Pertinent negatives include no chest pain, no headaches and no shortness of breath. Nothing aggravates the symptoms. Nothing relieves the symptoms. She has tried nothing for the symptoms. The treatment provided no relief.    Past Medical History:  Diagnosis Date  . 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 Hx of lumpectomy age 17    Patient Active Problem List   Diagnosis Date Noted  . Trichomoniasis 08/20/2017    Past Surgical History:  Procedure Laterality Date  . HERNIA REPAIR       OB History    Gravida  2   Para  2   Term  2   Preterm      AB      Living  2     SAB      TAB      Ectopic      Multiple  0   Live Births  2            Home Medications    Prior to Admission medications   Medication Sig Start Date End Date Taking? Authorizing Provider  ibuprofen (ADVIL,MOTRIN) 600 MG tablet Take 1 tablet (600 mg total) by mouth every 6 (six) hours as needed. 03/07/18   Arabella Merles, CNM  morphine (MSIR) 15 MG tablet Take 1 tablet (15 mg total) by mouth every 4 (four) hours as needed for severe pain. 06/07/18   Melene Plan, DO  Prenatal Multivit-Min-Fe-FA (PRENATAL VITAMINS PO) Take 1 tablet by mouth daily.      [provider]    Family History No family history on file.  Social History Social History   Tobacco Use  . Smoking status: Former Smoker    Types: Cigarettes    Last attempt to quit: 06/23/2017    Years since quitting: 0.9  . Smokeless tobacco: Never Used  Substance Use Topics  . Alcohol use: No  . Drug use: No     Allergies   Patient has no known allergies.   Review of Systems Review of Systems  Constitutional: Negative for chills and fever.  HENT: Negative for congestion and rhinorrhea.   Eyes: Negative for redness and visual disturbance.  Respiratory: Negative for shortness of breath and wheezing.   Cardiovascular: Negative for chest pain and palpitations.  Gastrointestinal: Negative for nausea and vomiting.  Genitourinary: Negative for dysuria and urgency.  Musculoskeletal: Positive for arthralgias. Negative for myalgias.  Skin: Negative for pallor and wound.  Neurological: Negative for dizziness and headaches.     Physical Exam Updated Vital Signs BP (!) 140/97   Pulse (!) 112   Temp 98.6 F (37  C) (Oral)   Resp (!) 27   LMP 05/18/2018   SpO2 99%   Physical Exam  Constitutional: She is oriented to person, place, and time. She appears well-developed and well-nourished. No distress.  HENT:  Head: Normocephalic and atraumatic.  Eyes: Pupils are equal, round, and reactive to light. EOM are normal.  Neck: Normal range of motion. Neck supple.  Cardiovascular: Normal rate and regular rhythm. Exam reveals no gallop and no friction rub.  No murmur heard. Pulmonary/Chest: Effort normal. She has no wheezes. She has no rales.  Abdominal: Soft. She exhibits no distension. There is no tenderness.  Musculoskeletal: She exhibits deformity. She exhibits no edema or tenderness.  Deformity to the left ankle with posterior displacement of the talus compared to the mortise.  Pulse motor and sensation is intact distally.  No tenderness throughout the medial or  lateral malleolus.  Neurological: She is alert and oriented to person, place, and time.  Skin: Skin is warm and dry. She is not diaphoretic.  Psychiatric: She has a normal mood and affect. Her behavior is normal.  Nursing note and vitals reviewed.    ED Treatments / Results  Labs (all labs ordered are listed, but only abnormal results are displayed) Labs Reviewed - No data to display  EKG EKG Interpretation  Date/Time:  Friday June 07 2018 04:28:48 EDT Ventricular Rate:  132 PR Interval:    QRS Duration: 90 QT Interval:  398 QTC Calculation: 589 R Axis:   70 Text Interpretation:  Sinus tachycardia Cannot rule out Inferior infarct , age undetermined Cannot rule out Anterior infarct , age undetermined Abnormal ECG No old tracing to compare Confirmed by Melene PlanFloyd, Deborah Sawyer [phone]([id number]) on 06/07/2018 4:40:55 AM   Radiology Dg Ankle Complete Left  Result Date: 06/07/2018 CLINICAL DATA:  Initial evaluation for acute trauma, fall. EXAM: LEFT ANKLE COMPLETE - 3+ VIEW COMPARISON:  None available. FINDINGS: Acute oblique fracture of the distal left fibula with lateral and posterior displacement. Probable acute displaced posterior malleolar fracture. Disruption of the ankle mortise and tibiotalar joint with lateral and posterior subluxation of the talus relative to the tibial plafond. Associated soft tissue swelling. Medial malleolus intact. IMPRESSION: Acute fracture dislocation of the left ankle as above. Electronically Signed   By: Rise MuBenjamin  McClintock M.D.   On: 06/07/2018 05:51   Dg Ankle Left Port  Result Date: 06/07/2018 CLINICAL DATA:  Postreduction left ankle EXAM: PORTABLE LEFT ANKLE - 2 VIEW COMPARISON:  06/07/2018 FINDINGS: Images are obtained through cast material, obscuring bone detail. Fracture of the distal left fibula is again demonstrated. There is improved alignment but still residual posterior displacement of the distal fracture fragment. Displacement is improved since previous study,  now measuring only about 6 mm. Small displaced posterior malleolar fracture fragments demonstrated. There is improved positioning of the tibiotalar joint with mild residual widening of the medial tibiotalar joint and mild medial angulation of the talus with respect to the tibia. IMPRESSION: Improved alignment and position of fractures of the distal left fibula with residual posterior displacement of the distal fracture fragment. Improved position of the tibiotalar joint with mild residual widening and angulation. Electronically Signed   By: Burman NievesWilliam  Stevens M.D.   On: 06/07/2018 06:20    Procedures Reduction of dislocation Date/Time: 06/07/2018 6:59 AM Performed by: Melene PlanFloyd, Bertrand Vowels, DO Authorized by: Melene PlanFloyd, Arville Postlewaite, DO  Consent: Verbal consent obtained. Written consent obtained. Risks and benefits: risks, benefits and alternatives were discussed Consent given by: patient Patient understanding: patient states understanding of the  procedure being performed Patient consent: the patient's understanding of the procedure matches consent given Imaging studies: imaging studies available Required items: required blood products, implants, devices, and special equipment available Patient identity confirmed: verbally with patient Time out: Immediately prior to procedure a "time out" was called to verify the correct patient, procedure, equipment, support staff and site/side marked as required. Local anesthesia used: yes Anesthesia: hematoma block  Anesthesia: Local anesthesia used: yes Local Anesthetic: lidocaine 2% with epinephrine Anesthetic total: 10 mL  Sedation: Patient sedated: no  Patient tolerance: Patient tolerated the procedure well with no immediate complications  .Joint Aspiration/Arthrocentesis Date/Time: 06/07/2018 7:00 AM Performed by: Melene PlanFloyd, Camie Hauss, DO Authorized by: Melene PlanFloyd, Liann Spaeth, DO   Consent:    Consent obtained:  Verbal   Consent given by:  Patient   Risks discussed:  Bleeding and  infection   Alternatives discussed:  Alternative treatment Location:    Location:  Ankle   Ankle:  L ankle Anesthesia (see MAR for exact dosages):    Anesthesia method:  Local infiltration   Local anesthetic:  Lidocaine 2% WITH epi Procedure details:    Preparation: Patient was prepped and draped in usual sterile fashion     Needle gauge: 21.   Ultrasound guidance: no     Approach:  Medial   Aspirate characteristics:  Blood-tinged   Steroid injected: no   Post-procedure details:    Dressing:  Adhesive bandage   Patient tolerance of procedure:  Tolerated well, no immediate complications   (including critical care time) SPLINT APPLICATION Date/Time: 7:03 AM Authorized by: Rae Roamaniel Patrick Kien Mirsky Consent: Verbal consent obtained. Risks and benefits: risks, benefits and alternatives were discussed Consent given by: patient Splint applied by: orthopedic technician Location details: left ankle Splint type: stirrup and posterior Supplies used: orthoglass Post-procedure: The splinted body part was neurovascularly unchanged following the procedure. Patient tolerance: Patient tolerated the procedure well with no immediate complications.    Medications Ordered in ED Medications  lidocaine-EPINEPHrine (XYLOCAINE W/EPI) 2 %-1:200000 (PF) injection 20 mL (20 mLs Other Given by Other 06/07/18 0527)  fentaNYL (SUBLIMAZE) injection 100 mcg (100 mcg Intravenous Given 06/07/18 0527)     Initial Impression / Assessment and Plan / ED Course  I have reviewed the triage vital signs and the nursing notes.  Pertinent labs & imaging results that were available during my care of the patient were reviewed by me and considered in my medical decision making (see chart for details).     32 yo F with a cc of left ankle pain.  Clinically this is dislocated we will obtain a plain film. Fx dislocation with lateral and posterior displacement.   Reduced at bedside.  Some medial space widening despite traction  medially while splint hardened, suspect ligamentous obstruction. Tachycardia due to recent cocaine use.  Placed in splint.  Ortho follow up.   7:03 AM:  I have discussed the diagnosis/risks/treatment options with the patient and family and believe the pt to be eligible for discharge home to follow-up with Ortho. We also discussed returning to the ED immediately if new or worsening sx occur. We discussed the sx which are most concerning (e.g., sudden worsening pain, fever, inability to tolerate by mouth) that necessitate immediate return. Medications administered to the patient during their visit and any new prescriptions provided to the patient are listed below.    Medications given during this visit Medications  lidocaine-EPINEPHrine (XYLOCAINE W/EPI) 2 %-1:200000 (PF) injection 20 mL (20 mLs Other Given by Other 06/07/18 0527)  fentaNYL (  SUBLIMAZE) injection 100 mcg (100 mcg Intravenous Given 06/07/18 0527)      The patient appears reasonably screen and/or stabilized for discharge and I doubt any other medical condition or other [facility] requiring further screening, evaluation, or treatment in the ED at this time prior to discharge.    Final Clinical Impressions(s) / ED Diagnoses   Final diagnoses:  Reduction deformity of left leg  Closed fracture dislocation of left ankle, initial encounter    ED Discharge Orders         Ordered    morphine (MSIR) 15 MG tablet  Every 4 hours PRN     06/07/18 0548           Melene Plan, DO 06/07/18 0703

## 2018-06-07 NOTE — ED Notes (Signed)
Procedural consent signed on paper - unable to obtain esignature as pad is broken. Paper consent signed and filed in medical records.

## 2018-06-07 NOTE — Discharge Instructions (Signed)

## 2018-06-07 NOTE — Progress Notes (Signed)
Orthopedic Tech Progress Note Patient Details:  Deborah Sawyer Jan 03, 1986 [id number]  Ortho Devices Type of Ortho Device: Post (short leg) splint, Stirrup splint Ortho Device/Splint Location: lle Ortho Device/Splint Interventions: Ordered, Application, Adjustment   Post Interventions Patient Tolerated: Well Instructions Provided: Care of device, Adjustment of device Applied post reduction with drs assistance.  Trinna PostMartinez, [name] 06/07/2018, 5:45 AM

## 2018-06-07 NOTE — Progress Notes (Signed)
Orthopedic Tech Progress Note Patient Details:  Deborah Sawyer 14-Mar-1986 [id number]  Ortho Devices Type of Ortho Device: Crutches Ortho Device/Splint Location: lle Ortho Device/Splint Interventions: Ordered, Application, Adjustment   Post Interventions Patient Tolerated: Well Instructions Provided: Care of device, Adjustment of device   Deborah Sawyer, Ashlan Dignan J 06/07/2018, 6:58 AM

## 2018-06-07 NOTE — ED Triage Notes (Signed)
Pt was out with friends tonight and stepped of a curb turning her left ankle. Apparent dislocation. Endorses cocaine use tonight. Tachycardic. Dr. Adela LankFLoyd at bedside.

## 2018-06-07 NOTE — ED Notes (Signed)
Patient left at this time with all belongings. 

## 2018-06-11 ENCOUNTER — Ambulatory Visit: Payer: Self-pay | Admitting: Student

## 2018-06-11 DIAGNOSIS — S82842A Displaced bimalleolar fracture of left lower leg, initial encounter for closed fracture: Secondary | ICD-10-CM | POA: Insufficient documentation

## 2018-06-12 ENCOUNTER — Encounter (HOSPITAL_COMMUNITY): Payer: Self-pay | Admitting: *Deleted

## 2018-06-12 ENCOUNTER — Other Ambulatory Visit: Payer: Self-pay

## 2018-06-12 NOTE — Progress Notes (Signed)
Spoke with pt for pre-op call. She denies cardiac history. I asked her about the EKG that was done recently for tachycardia and she said it was due to the pain she was having, no previous hx of tachycardia. Pt states she is not diabetic.

## 2018-06-13 NOTE — Anesthesia Preprocedure Evaluation (Addendum)
Anesthesia Evaluation  Patient identified by MRN, date of birth, ID band Patient awake    Reviewed: Allergy & Precautions, NPO status , Patient's Chart, lab work & pertinent test results  Airway Mallampati: II  TM Distance: >3 FB Neck ROM: Full    Dental no notable dental hx. (+) Teeth Intact, Dental Advisory Given   Pulmonary former smoker,    Pulmonary exam normal breath sounds clear to auscultation       Cardiovascular Normal cardiovascular exam Rhythm:Regular Rate:Normal  EKG 06/07/2018: Sinus tachycardia. Cannot rule out Inferior infarct , age undetermined. Cannot rule out Anterior infarct , age undetermined. QT/QTc 398/589 ms. Pt admitted to recent cocaine use which likely explains prolonged QTc.   Neuro/Psych negative neurological ROS  negative psych ROS   GI/Hepatic negative GI ROS, (+)     substance abuse  cocaine use,   Endo/Other  negative endocrine ROS  Renal/GU negative Renal ROS  negative genitourinary   Musculoskeletal negative musculoskeletal ROS (+)   Abdominal   Peds  Hematology negative hematology ROS (+)   Anesthesia Other Findings Acute ankle fracture  Reproductive/Obstetrics                            Anesthesia Physical Anesthesia Plan  ASA: II  Anesthesia Plan: General and Regional   Post-op Pain Management:  Regional for Post-op pain   Induction: Intravenous  PONV Risk Score and Plan: 3 and Treatment may vary due to age or medical condition, Dexamethasone, Ondansetron and Midazolam  Airway Management Planned: LMA  Additional Equipment:   Intra-op Plan:   Post-operative Plan: Extubation in OR  Informed Consent: I have reviewed the patients History and Physical, chart, labs and discussed the procedure including the risks, benefits and alternatives for the proposed anesthesia with the patient or authorized representative who has indicated his/her  understanding and acceptance.   Dental advisory given  Plan Discussed with: Anesthesiologist  Anesthesia Plan Comments: (Patient identified. Risks, benefits, options discussed with patient including but not limited to bleeding, infection, nerve damage, paralysis, failed block, incomplete pain control, headache, blood pressure changes, nausea, vomiting, reactions to medication, itching, and post partum back pain. Confirmed with bedside nurse the patient's most recent platelet count. Confirmed with the patient that they are not taking any anticoagulation, have any bleeding history or any family history of bleeding disorders. Patient expressed understanding and wishes to proceed. All questions were answered. )        Anesthesia Quick Evaluation

## 2018-06-13 NOTE — H&P (Signed)
[facility] ([facility]) H&P  Patient ID: Deborah Gauzemmani D Borello MRN: [id number] DOB/AGE: September 18, 1986 32 y.o.  Reason for Surgery: Left ankle fracture  HPI: Deborah Sawyer is an 32 y.o. female who is presenting for surgery for open reduction internal fixation of her left ankle.  She sustained a left ankle injury few days ago.  She was reduced and splinted the emergency room.  She returned to my clinic earlier this week and discussions were had regarding fixation.  She otherwise is healthy C.  She is a stay-at-home mom.  No major medical problems she is otherwise healthy.  Past Medical History:  Diagnosis Date  . Anemia    during 1st pregnancy  . Hernia of abdominal cavity age 89  . Hx of lumpectomy age 32    Past Surgical History:  Procedure Laterality Date  . BREAST LUMPECTOMY Left    benign, at age 32  . HERNIA REPAIR      History reviewed. No pertinent family history.  Social History:  reports that she quit smoking about a year ago. Her smoking use included cigarettes. She has never used smokeless tobacco. She reports that she does not drink alcohol or use drugs.  Allergies: No Known Allergies  Medications: No current facility-administered medications on file prior to encounter.    Current Outpatient Medications on File Prior to Encounter  Medication Sig Dispense Refill  . ibuprofen (ADVIL,MOTRIN) 600 MG tablet Take 1 tablet (600 mg total) by mouth every 6 (six) hours as needed. 30 tablet 0  . morphine (MSIR) 15 MG tablet Take 1 tablet (15 mg total) by mouth every 4 (four) hours as needed for severe pain. 5 tablet 0  . Prenatal Multivit-Min-Fe-FA (PRENATAL VITAMINS PO) Take 1 tablet by mouth daily.       ROS: Constitutional: No fever or chills Vision: No changes in vision ENT: No difficulty swallowing CV: No chest pain Pulm: No SOB or wheezing GI: No nausea or vomiting GU: No urgency or inability to hold urine Skin: No poor wound healing Neurologic: No numbness or  tingling Psychiatric: No depression or anxiety Heme: No bruising Allergic: No reaction to medications or food   Exam: General: No acute distress, awake alert and oriented x3 cooperative and pleasant.  Left lower extremity: Splint cut down it is skin is clean dry and intact.  There is no lesions or lacerations.  The skin wrinkles appropriately.  There is notable swelling however the ankle is reduced.  She endorses sensation in the dorsum plantar aspect of her foot.  She has ability to wiggle her toes and move her ankle up and down.  Compartments are soft and compressible.  Medical Decision Making: Imaging: X-rays from the emergency room showed a lateral malleolus fracture dislocation with associated small posterior malleolus fracture.  No medial malleolus fracture.  Postreduction shows some continued medial clear space widening.  Medical history and chart was reviewed  Assessment/Plan: 32 year old female with a left bimalleolar ankle fracture  Plan to proceed with open reduction internal fixation of the fibula with possible syndesmotic fixation.  Risks and benefits were discussed with patient. Risks discussed included bleeding requiring blood transfusion, bleeding causing a hematoma, infection, malunion, nonunion, damage to surrounding nerves and blood vessels, pain, hardware prominence or irritation, hardware failure, stiffness, post-traumatic arthritis, DVT/PE, compartment syndrome, and even death.  She agrees to proceed.  She will be discharged home from the surgery   Roby LoftsKevin P. Haddix, MD [facility] [phone] (phone)

## 2018-06-13 NOTE — Progress Notes (Signed)
Anesthesia Chart Review:  Case:  [id number] Date/Time:  06/14/18 0715   Procedure:  OPEN REDUCTION INTERNAL FIXATION (ORIF) ANKLE FRACTURE (Left )   Anesthesia type:  General   Diagnosis:  Bimalleolar ankle fracture, left, closed, initial encounter [Z30.865H][S82.842A]   Pre-op diagnosis:  left ankle fracture   Location:  [facility] / [facility]   Surgeon:  Roby LoftsHaddix, Kevin P, MD      DISCUSSION: 32 yo female former smoker for above procedure. Pertinent hx includes recent cocaine use.  Asked to review chart due to abnormal EKG. Pt was seen at ED 06/07/2018 for acute ankle fracture. Pt also reported that she had used cocaine that day. EKG done in ED showed ventricular rate of 132 with prolonged QTc 589. She has no known cardiac history. Cocaine has a well known effect of QTc prolongation and EKG abnormalities likely explained by drug use. She recently gave birth to healthy baby by SVD on 03/05/18.   Repeat EKG on DOS. Anticipate she can proceed with surgery as planned barring acute status change and EKG acceptable.   VS: LMP 05/18/2018   PROVIDERS: Patient, No Pcp Per   LABS: Pt will need DOS labs and urine pregnancy test  Labs Reviewed - No data to display   IMAGES: N/A   EKG: EKG 06/07/2018: Sinus tachycardia. Cannot rule out Inferior infarct , age undetermined. Cannot rule out Anterior infarct , age undetermined. QT/QTc 398/589 ms. Pt admitted to recent cocaine use which likely explains prolonged QTc.   CV: N/A  Past Medical History:  Diagnosis Date  . Anemia    during 1st pregnancy  . Hernia of abdominal cavity age 89  . Hx of lumpectomy age 32    Past Surgical History:  Procedure Laterality Date  . BREAST LUMPECTOMY Left    benign, at age 32  . HERNIA REPAIR      MEDICATIONS: No current facility-administered medications for this encounter.    Marland Kitchen. ibuprofen (ADVIL,MOTRIN) 600 MG tablet  . morphine (MSIR) 15 MG tablet  . Prenatal Multivit-Min-Fe-FA (PRENATAL VITAMINS PO)    Zannie CoveJames  Burns, PA-C [facility] Phone [phone] 06/13/2018 9:00 AM

## 2018-06-14 ENCOUNTER — Ambulatory Visit (HOSPITAL_COMMUNITY)
Admission: RE | Admit: 2018-06-14 | Discharge: 2018-06-14 | Disposition: A | Discharge status: Home / Self Care | Payer: Medicaid Other | Source: Ambulatory Visit | Attending: Student | Admitting: Student

## 2018-06-14 ENCOUNTER — Encounter (HOSPITAL_COMMUNITY)
Admission: RE | Disposition: A | Discharge status: Home / Self Care | Payer: Self-pay | Source: Ambulatory Visit | Attending: Student

## 2018-06-14 ENCOUNTER — Ambulatory Visit (HOSPITAL_COMMUNITY): Payer: Medicaid Other

## 2018-06-14 ENCOUNTER — Ambulatory Visit (HOSPITAL_COMMUNITY): Payer: Medicaid Other | Admitting: Physician Assistant

## 2018-06-14 ENCOUNTER — Encounter (HOSPITAL_COMMUNITY): Payer: Self-pay

## 2018-06-14 DIAGNOSIS — M25372 Other instability, left ankle: Secondary | ICD-10-CM | POA: Diagnosis not present

## 2018-06-14 DIAGNOSIS — S93492A Sprain of other ligament of left ankle, initial encounter: Secondary | ICD-10-CM | POA: Diagnosis not present

## 2018-06-14 DIAGNOSIS — Z87891 Personal history of nicotine dependence: Secondary | ICD-10-CM | POA: Insufficient documentation

## 2018-06-14 DIAGNOSIS — X58XXXA Exposure to other specified factors, initial encounter: Secondary | ICD-10-CM | POA: Diagnosis not present

## 2018-06-14 DIAGNOSIS — S82842A Displaced bimalleolar fracture of left lower leg, initial encounter for closed fracture: Secondary | ICD-10-CM | POA: Diagnosis present

## 2018-06-14 DIAGNOSIS — T148XXA Other injury of unspecified body region, initial encounter: Secondary | ICD-10-CM

## 2018-06-14 HISTORY — DX: Anemia, unspecified: D64.9

## 2018-06-14 HISTORY — PX: ORIF ANKLE FRACTURE: SHX5408

## 2018-06-14 LAB — CBC
HEMATOCRIT: 36.2 % (ref 36.0–46.0)
HEMOGLOBIN: 11.3 g/dL — AB (ref 12.0–15.0)
MCH: 28 pg (ref 26.0–34.0)
MCHC: 31.2 g/dL (ref 30.0–36.0)
MCV: 89.6 fL (ref 78.0–100.0)
Platelets: 243 10*3/uL (ref 150–400)
RBC: 4.04 MIL/uL (ref 3.87–5.11)
RDW: 14.1 % (ref 11.5–15.5)
WBC: 7.1 10*3/uL (ref 4.0–10.5)

## 2018-06-14 LAB — POCT PREGNANCY, URINE: Preg Test, Ur: NEGATIVE

## 2018-06-14 LAB — SURGICAL PCR SCREEN
MRSA, PCR: NEGATIVE
STAPHYLOCOCCUS AUREUS: POSITIVE — AB

## 2018-06-14 SURGERY — OPEN REDUCTION INTERNAL FIXATION (ORIF) ANKLE FRACTURE
Anesthesia: Regional | Laterality: Left

## 2018-06-14 MED ORDER — HYDROCODONE-ACETAMINOPHEN 5-325 MG PO TABS
1.0000 | ORAL_TABLET | Freq: Once | ORAL | Status: AC
  Administered 2018-06-14: 1 via ORAL

## 2018-06-14 MED ORDER — HYDROMORPHONE HCL 1 MG/ML IJ SOLN
INTRAMUSCULAR | Status: AC
  Administered 2018-06-14: 0.5 mg via INTRAVENOUS
  Filled 2018-06-14: qty 1

## 2018-06-14 MED ORDER — CEFAZOLIN SODIUM-DEXTROSE 2-4 GM/100ML-% IV SOLN
INTRAVENOUS | Status: AC
  Filled 2018-06-14: qty 100

## 2018-06-14 MED ORDER — 0.9 % SODIUM CHLORIDE (POUR BTL) OPTIME
TOPICAL | Status: DC | PRN
  Administered 2018-06-14: 1000 mL

## 2018-06-14 MED ORDER — LACTATED RINGERS IV SOLN
INTRAVENOUS | Status: DC | PRN
  Administered 2018-06-14 (×2): via INTRAVENOUS

## 2018-06-14 MED ORDER — HYDROCODONE-ACETAMINOPHEN 5-325 MG PO TABS
ORAL_TABLET | ORAL | Status: AC
  Filled 2018-06-14: qty 1

## 2018-06-14 MED ORDER — PROPOFOL 10 MG/ML IV BOLUS
INTRAVENOUS | Status: AC
  Filled 2018-06-14: qty 40

## 2018-06-14 MED ORDER — DEXAMETHASONE SODIUM PHOSPHATE 10 MG/ML IJ SOLN
INTRAMUSCULAR | Status: DC | PRN
  Administered 2018-06-14: 10 mg via INTRAVENOUS

## 2018-06-14 MED ORDER — VANCOMYCIN HCL 1000 MG IV SOLR
INTRAVENOUS | Status: DC | PRN
  Administered 2018-06-14: 1000 mg

## 2018-06-14 MED ORDER — PROPOFOL 10 MG/ML IV BOLUS
INTRAVENOUS | Status: DC | PRN
  Administered 2018-06-14: 160 mg via INTRAVENOUS

## 2018-06-14 MED ORDER — HYDROCODONE-ACETAMINOPHEN 5-325 MG PO TABS: 1.0000 | ORAL_TABLET | Freq: Four times a day (QID) | ORAL | 0 refills | Status: DC | PRN

## 2018-06-14 MED ORDER — LIDOCAINE HCL (CARDIAC) PF 100 MG/5ML IV SOSY
PREFILLED_SYRINGE | INTRAVENOUS | Status: DC | PRN
  Administered 2018-06-14: 100 mg via INTRAVENOUS

## 2018-06-14 MED ORDER — BACITRACIN ZINC 500 UNIT/GM EX OINT
TOPICAL_OINTMENT | CUTANEOUS | Status: AC
  Filled 2018-06-14: qty 28.35

## 2018-06-14 MED ORDER — ONDANSETRON HCL 4 MG/2ML IJ SOLN
INTRAMUSCULAR | Status: AC
  Filled 2018-06-14: qty 2

## 2018-06-14 MED ORDER — VANCOMYCIN HCL 1000 MG IV SOLR
INTRAVENOUS | Status: AC
  Filled 2018-06-14: qty 1000

## 2018-06-14 MED ORDER — CHLORHEXIDINE GLUCONATE 4 % EX LIQD: 60.0000 mL | Freq: Once | CUTANEOUS | Status: DC

## 2018-06-14 MED ORDER — ONDANSETRON HCL 4 MG/2ML IJ SOLN
INTRAMUSCULAR | Status: DC | PRN
  Administered 2018-06-14: 4 mg via INTRAVENOUS

## 2018-06-14 MED ORDER — BUPIVACAINE HCL (PF) 0.5 % IJ SOLN
INTRAMUSCULAR | Status: AC
  Filled 2018-06-14: qty 30

## 2018-06-14 MED ORDER — FENTANYL CITRATE (PF) 250 MCG/5ML IJ SOLN
INTRAMUSCULAR | Status: DC | PRN
  Administered 2018-06-14 (×2): 50 ug via INTRAVENOUS
  Administered 2018-06-14: 25 ug via INTRAVENOUS
  Administered 2018-06-14: 100 ug via INTRAVENOUS
  Administered 2018-06-14: 25 ug via INTRAVENOUS

## 2018-06-14 MED ORDER — DEXAMETHASONE SODIUM PHOSPHATE 10 MG/ML IJ SOLN
INTRAMUSCULAR | Status: AC
  Filled 2018-06-14: qty 1

## 2018-06-14 MED ORDER — ROPIVACAINE HCL 5 MG/ML IJ SOLN
INTRAMUSCULAR | Status: DC | PRN
  Administered 2018-06-14 (×2): 20 mL via PERINEURAL

## 2018-06-14 MED ORDER — MIDAZOLAM HCL 2 MG/2ML IJ SOLN
INTRAMUSCULAR | Status: DC | PRN
  Administered 2018-06-14: 2 mg via INTRAVENOUS

## 2018-06-14 MED ORDER — BUPIVACAINE HCL 0.5 % IJ SOLN
INTRAMUSCULAR | Status: DC | PRN
  Administered 2018-06-14: .01 mL

## 2018-06-14 MED ORDER — BACITRACIN 500 UNIT/GM EX OINT
TOPICAL_OINTMENT | CUTANEOUS | Status: DC | PRN
  Administered 2018-06-14: 1 via TOPICAL

## 2018-06-14 MED ORDER — CEFAZOLIN SODIUM-DEXTROSE 2-4 GM/100ML-% IV SOLN
2.0000 g | INTRAVENOUS | Status: AC
  Administered 2018-06-14: 2 g via INTRAVENOUS

## 2018-06-14 MED ORDER — LIDOCAINE 2% (20 MG/ML) 5 ML SYRINGE
INTRAMUSCULAR | Status: AC
  Filled 2018-06-14: qty 5

## 2018-06-14 MED ORDER — HYDROMORPHONE HCL 1 MG/ML IJ SOLN
0.2500 mg | INTRAMUSCULAR | Status: DC | PRN
  Administered 2018-06-14 (×4): 0.5 mg via INTRAVENOUS

## 2018-06-14 MED ORDER — MIDAZOLAM HCL 2 MG/2ML IJ SOLN
INTRAMUSCULAR | Status: AC
  Filled 2018-06-14: qty 2

## 2018-06-14 MED ORDER — FENTANYL CITRATE (PF) 250 MCG/5ML IJ SOLN
INTRAMUSCULAR | Status: AC
  Filled 2018-06-14: qty 5

## 2018-06-14 SURGICAL SUPPLY — 66 items
BANDAGE ACE 4X5 VEL STRL LF (GAUZE/BANDAGES/DRESSINGS) ×2 IMPLANT
BANDAGE ACE 6X5 VEL STRL LF (GAUZE/BANDAGES/DRESSINGS) ×2 IMPLANT
BANDAGE ESMARK 6X9 LF (GAUZE/BANDAGES/DRESSINGS) ×1 IMPLANT
BIT DRILL LCP QC 2X140 (BIT) ×2 IMPLANT
BIT DRILL QC 2.7MM (BIT) IMPLANT
BNDG CMPR 9X6 STRL LF SNTH (GAUZE/BANDAGES/DRESSINGS) ×1
BNDG COHESIVE 4X5 TAN STRL (GAUZE/BANDAGES/DRESSINGS) ×3 IMPLANT
BNDG ESMARK 6X9 LF (GAUZE/BANDAGES/DRESSINGS) ×3
BRUSH SCRUB SURG 4.25 DISP (MISCELLANEOUS) ×4 IMPLANT
CHLORAPREP W/TINT 26ML (MISCELLANEOUS) ×3 IMPLANT
COVER SURGICAL LIGHT HANDLE (MISCELLANEOUS) ×3 IMPLANT
DRAPE C-ARM 42X72 X-RAY (DRAPES) ×3 IMPLANT
DRAPE C-ARMOR (DRAPES) ×3 IMPLANT
DRAPE ORTHO SPLIT 77X108 STRL (DRAPES) ×6
DRAPE SURG ORHT 6 SPLT 77X108 (DRAPES) ×2 IMPLANT
DRAPE U-SHAPE 47X51 STRL (DRAPES) ×3 IMPLANT
DRSG ADAPTIC 3X8 NADH LF (GAUZE/BANDAGES/DRESSINGS) IMPLANT
DRSG EMULSION OIL 3X3 NADH (GAUZE/BANDAGES/DRESSINGS) ×2 IMPLANT
ELECT REM PT RETURN 9FT ADLT (ELECTROSURGICAL) ×3
ELECTRODE REM PT RTRN 9FT ADLT (ELECTROSURGICAL) ×1 IMPLANT
GAUZE SPONGE 4X4 12PLY STRL (GAUZE/BANDAGES/DRESSINGS) IMPLANT
GAUZE SPONGE 4X4 12PLY STRL LF (GAUZE/BANDAGES/DRESSINGS) ×2 IMPLANT
GLOVE BIO SURGEON STRL SZ7.5 (GLOVE) ×9 IMPLANT
GLOVE BIOGEL PI IND STRL 6.5 (GLOVE) IMPLANT
GLOVE BIOGEL PI IND STRL 7.5 (GLOVE) ×1 IMPLANT
GLOVE BIOGEL PI INDICATOR 6.5 (GLOVE) ×2
GLOVE BIOGEL PI INDICATOR 7.5 (GLOVE) ×2
GOWN STRL REUS W/ TWL LRG LVL3 (GOWN DISPOSABLE) ×2 IMPLANT
GOWN STRL REUS W/TWL LRG LVL3 (GOWN DISPOSABLE) ×6
KIT TURNOVER KIT B (KITS) ×3 IMPLANT
MANIFOLD NEPTUNE II (INSTRUMENTS) ×1 IMPLANT
NDL HYPO 21X1.5 SAFETY (NEEDLE) IMPLANT
NEEDLE 25GAX1.5 (MISCELLANEOUS) ×3 IMPLANT
NEEDLE HYPO 21X1.5 SAFETY (NEEDLE) IMPLANT
NS IRRIG 1000ML POUR BTL (IV SOLUTION) ×3 IMPLANT
PACK TOTAL JOINT (CUSTOM PROCEDURE TRAY) ×3 IMPLANT
PAD ABD 8X10 STRL (GAUZE/BANDAGES/DRESSINGS) ×2 IMPLANT
PAD ARMBOARD 7.5X6 YLW CONV (MISCELLANEOUS) ×4 IMPLANT
PAD CAST 4YDX4 CTTN HI CHSV (CAST SUPPLIES) IMPLANT
PADDING CAST COTTON 4X4 STRL (CAST SUPPLIES) ×3
PADDING CAST COTTON 6X4 STRL (CAST SUPPLIES) ×2 IMPLANT
PLATE LCP 3.5 1/3 TUB 6HX69 (Plate) ×2 IMPLANT
PROS DRILL BIT QC 2.7MM (BIT) ×3
SCREW CORT LP ST 3.5X22 (Screw) ×2 IMPLANT
SCREW CORTEX 2.7X16MM (Screw) ×4 IMPLANT
SCREW HEADED ST 3.5X12 (Screw) ×4 IMPLANT
SCREW HEADED ST 3.5X18 (Screw) ×2 IMPLANT
SCREW HEADED ST 3.5X48 (Screw) ×2 IMPLANT
SCREW LOCK T15 FT 14X3.5X2.9X (Screw) IMPLANT
SCREW LOCKING 3.5X14 (Screw) ×3 IMPLANT
SPLINT PLASTER CAST XFAST 5X30 (CAST SUPPLIES) IMPLANT
SPLINT PLASTER XFAST SET 5X30 (CAST SUPPLIES) ×2
STAPLER VISISTAT 35W (STAPLE) ×3 IMPLANT
SUCTION FRAZIER HANDLE 10FR (MISCELLANEOUS) ×2
SUCTION TUBE FRAZIER 10FR DISP (MISCELLANEOUS) ×1 IMPLANT
SUT ETHILON 3 0 PS 1 (SUTURE) ×6 IMPLANT
SUT PROLENE 0 CT (SUTURE) IMPLANT
SUT VIC AB 0 CT1 27 (SUTURE) ×3
SUT VIC AB 0 CT1 27XBRD ANBCTR (SUTURE) ×1 IMPLANT
SUT VIC AB 2-0 CT1 27 (SUTURE) ×6
SUT VIC AB 2-0 CT1 TAPERPNT 27 (SUTURE) ×2 IMPLANT
SYR CONTROL 10ML LL (SYRINGE) ×3 IMPLANT
TOWEL OR 17X24 6PK STRL BLUE (TOWEL DISPOSABLE) ×3 IMPLANT
TOWEL OR 17X26 10 PK STRL BLUE (TOWEL DISPOSABLE) ×6 IMPLANT
UNDERPAD 30X30 (UNDERPADS AND DIAPERS) ×3 IMPLANT
WATER STERILE IRR 1000ML POUR (IV SOLUTION) ×3 IMPLANT

## 2018-06-14 NOTE — Anesthesia Postprocedure Evaluation (Signed)
Anesthesia Post Note  Patient: Deborah Sawyer  Procedure(s) Performed: OPEN REDUCTION INTERNAL FIXATION (ORIF) ANKLE FRACTURE (Left )     Patient location during evaluation: PACU Anesthesia Type: General Level of consciousness: awake and alert Pain management: pain level controlled Vital Signs Assessment: post-procedure vital signs reviewed and stable Respiratory status: spontaneous breathing, nonlabored ventilation, respiratory function stable and patient connected to nasal cannula oxygen Cardiovascular status: blood pressure returned to baseline and stable Postop Assessment: no apparent nausea or vomiting Anesthetic complications: no    Last Vitals:  Vitals:   06/14/18 1004 06/14/18 1019  BP: (!) 145/93 (!) 147/94  Pulse: 73 72  Resp: 13 12  Temp: (!) 36.2 C   SpO2: 95% 96%    Last Pain:  Vitals:   06/14/18 1004  TempSrc:   PainSc: 6                  Navayah Sok L Gricel Copen

## 2018-06-14 NOTE — Discharge Instructions (Addendum)
[facility] Discharge Instructions   General Discharge Instructions  WEIGHT BEARING STATUS: Do not put weight on your leg, continue your crutches  RANGE OF MOTION/ACTIVITY: Keep splint clean, dry and intact until follow up  Wound Care: Keep splint clean, dry and intact  DVT/PE prophylaxis: Take a baby aspirin 81 mg once a day to prevent blood clots  Diet: as you were eating previously.  Can use over the counter stool softeners and bowel preparations, such as Miralax, to help with bowel movements.  Narcotics can be constipating.  Be sure to drink plenty of fluids  PAIN MEDICATION USE AND EXPECTATIONS  You have likely been given narcotic medications to help control your pain.  After a traumatic event that results in an fracture (broken bone) with or without surgery, it is ok to use narcotic pain medications to help control one's pain.  We understand that everyone responds to pain differently and each individual patient will be evaluated on a regular basis for the continued need for narcotic medications. Ideally, narcotic medication use should last no more than 6-8 weeks (coinciding with fracture healing).   As a patient it is your responsibility as well to monitor narcotic medication use and report the amount and frequency you use these medications when you come to your office visit.   We would also advise that if you are using narcotic medications, you should take a dose prior to therapy to maximize you participation.  IF YOU ARE ON NARCOTIC MEDICATIONS IT IS NOT PERMISSIBLE TO OPERATE A MOTOR VEHICLE (MOTORCYCLE/CAR/TRUCK/MOPED) OR HEAVY MACHINERY DO NOT MIX NARCOTICS WITH OTHER CNS (CENTRAL NERVOUS SYSTEM) DEPRESSANTS SUCH AS ALCOHOL   STOP SMOKING OR USING NICOTINE PRODUCTS!!!!  As discussed nicotine severely impairs your body's ability to heal surgical and traumatic wounds but also impairs bone healing.  Wounds and bone heal by forming microscopic blood vessels  (angiogenesis) and nicotine is a vasoconstrictor (essentially, shrinks blood vessels).  Therefore, if vasoconstriction occurs to these microscopic blood vessels they essentially disappear and are unable to deliver necessary nutrients to the healing tissue.  This is one modifiable factor that you can do to dramatically increase your chances of healing your injury.    (This means no smoking, no nicotine gum, patches, etc)  DO NOT USE NONSTEROIDAL ANTI-INFLAMMATORY DRUGS (NSAID'S)  Using products such as Advil (ibuprofen), Aleve (naproxen), Motrin (ibuprofen) for additional pain control during fracture healing can delay and/or prevent the healing response.  If you would like to take over the counter (OTC) medication, Tylenol (acetaminophen) is ok.  However, some narcotic medications that are given for pain control contain acetaminophen as well. Therefore, you should not exceed more than 4000 mg of tylenol in a day if you do not have liver disease.  Also note that there are may OTC medicines, such as cold medicines and allergy medicines that my contain tylenol as well.  If you have any questions about medications and/or interactions please ask your doctor/PA or your pharmacist.      ICE AND ELEVATE INJURED/OPERATIVE EXTREMITY  Using ice and elevating the injured extremity above your heart can help with swelling and pain control.  Icing in a pulsatile fashion, such as 20 minutes on and 20 minutes off, can be followed.    Do not place ice directly on skin. Make sure there is a barrier between to skin and the ice pack.    Using frozen items such as frozen peas works well as the conform nicely to the are that  needs to be iced.  USE AN ACE WRAP OR TED HOSE FOR SWELLING CONTROL  In addition to icing and elevation, Ace wraps or TED hose are used to help limit and resolve swelling.  It is recommended to use Ace wraps or TED hose until you are informed to stop.    When using Ace Wraps start the wrapping distally  (farthest away from the body) and wrap proximally (closer to the body)   Example: If you had surgery on your leg or thing and you do not have a splint on, start the ace wrap at the toes and work your way up to the thigh        If you had surgery on your upper extremity and do not have a splint on, start the ace wrap at your fingers and work your way up to the upper arm  IF YOU ARE IN A SPLINT OR CAST DO NOT REMOVE IT FOR ANY REASON   If your splint gets wet for any reason please contact the office immediately. You may shower in your splint or cast as long as you keep it dry.  This can be done by wrapping in a cast cover or garbage back (or similar)  Do Not stick any thing down your splint or cast such as pencils, money, or hangers to try and scratch yourself with.  If you feel itchy take benadryl as prescribed on the bottle for itching  IF YOU ARE IN A CAM BOOT (BLACK BOOT)  You may remove boot periodically. Perform daily dressing changes as noted below.  Wash the liner of the boot regularly and wear a sock when wearing the boot. It is recommended that you sleep in the boot until told otherwise  CALL THE OFFICE WITH ANY QUESTIONS OR CONCERNS: [phone]

## 2018-06-14 NOTE — Anesthesia Procedure Notes (Signed)
Procedure Name: LMA Insertion Date/Time: 06/14/2018 7:45 AM Performed by: Yolonda Kidaarver, Delano Frate L, CRNA Pre-anesthesia Checklist: Patient identified, Emergency Drugs available, Suction available and Patient being monitored Patient Re-evaluated:Patient Re-evaluated prior to induction Oxygen Delivery Method: Circle system utilized Preoxygenation: Pre-oxygenation with 100% oxygen Induction Type: IV induction Ventilation: Mask ventilation without difficulty LMA: LMA inserted LMA Size: 4.0 Number of attempts: 1 Placement Confirmation: positive ETCO2,  CO2 detector and breath sounds checked- equal and bilateral Tube secured with: Tape Dental Injury: Teeth and Oropharynx as per pre-operative assessment

## 2018-06-14 NOTE — Op Note (Signed)
OrthopaedicSurgeryOperativeNote (RUE:[id number](CSN:[id number]) Date of Surgery: 06/14/2018  Admit Date: 06/14/2018   Diagnoses: Pre-Op Diagnoses: Left bimalleolar ankle fracture  Post-Op Diagnosis: Left bimalleolar ankle fracture Left syndesmotic disruption  Procedures: 1. CPT 27814-Open reduction internal fixation left bimalleolar ankle fracture 2. CPT 27829-Open reduction internal fixation of left syndesmosis  Surgeons: Primary: Roby LoftsHaddix, Kevin P, MD   Location:[facility] ROOM 03   AnesthesiaGeneral   Antibiotics:Ancef 2g preop   Tourniquettime: Total Tourniquet Time Documented: Thigh (Left) - 53 minutes Total: Thigh (Left) - 53 minutes  .  EstimatedBloodLoss:5 mL   Complications:None  Specimens:None  Implants: Implant Name Type Inv. Item Serial No. Manufacturer Lot No. LRB No. Used Action  SCREW CORTEX 2.7X16MM - [id number] Screw SCREW CORTEX 2.7X16MM  [employer]  Left 2 Implanted  PLATE TUBULAR W/COLLAR - [id number] Plate PLATE TUBULAR W/COLLAR  [employer]  Left 1 Implanted  SCREW LOCKING 3.5X14 - [id number] Screw SCREW LOCKING 3.5X14  [employer]  Left 1 Implanted  SCREW HEADED ST 3.5X48 - [id number] Screw SCREW HEADED ST 3.5X48  [employer]  Left 1 Implanted  SCREW HEADED ST 3.5X18 - [id number] Screw SCREW HEADED ST 3.5X18  [employer]  Left 1 Implanted  SCREW HEADED ST 3.5X12 - [id number] Screw SCREW HEADED ST 3.5X12  [employer]  Left 2 Implanted    IndicationsforSurgery: 32 year old female who sustained a bimalleolar ankle fracture dislocation that was reduced by the emergency room.  She saw me earlier in the week at which point I determined that her skin was appropriate for surgical incision.  I discussed risks and benefits with the patient regarding open reduction internal fixation. Risks discussed included bleeding requiring blood transfusion, bleeding causing a hematoma, infection, malunion, nonunion, damage to surrounding nerves and blood vessels,  pain, hardware prominence or irritation, hardware failure, stiffness, post-traumatic arthritis, DVT/PE, compartment syndrome, and even death.  Operative Findings: 1.  Open reduction internal fixation of left distal fibula fracture using 2.7 mm lag screws with a 6 hole one third tubular neutralization plate.  No fixation provided for the posterior malleolus. 2.  Significant instability at the syndesmosis fixed with a 3.5 mm quadricortical syndesmotic screw  Procedure: The patient was identified in the preoperative holding area. Consent was confirmed with the patient and their family and all questions were answered. The operative extremity was marked after confirmation with the patient. he was then brought back to the operating room by our anesthesia colleagues. She was carefully transferred to a radiolucent flap top table and all bony prominences were well padded. He was placed under general anesthesia. The operative extremity was then prepped and draped in usual sterile fashion. A preoperative timeout was performed to verify the patient, the procedure, and the extremity. Preoperative antibiotics were dosed.  I used fluoroscopy to mark an incision and elevated the tourniquet. A incision over the fibula was made. It was carried through skin and subcutaneous tissue taking care to protect the superficial peroneal nerve. Subperiosteal dissection was performed to identify the fracture. A 15 blade was used to clear the fracture site. I then used multiple reduction clamps to anatomically reduce the fracture. I placed two, 2.407mm lag screws for provisional fixation. I then placed a 6 hole 1/3rd tubular neutralization plate.  The syndesmosis was then evaluated. The syndesmosis in my opinion was clearly disrupted. The medial clear space was significantly widened.  Manual compression was applied medially to the fibula and a 2.75mm drill bit was use to drill across both the fibula and tibia. A 3.775mm lag screw  was placed  across the syndesmosis gaining quadricortical fixation.  Final fluoroscopic films were obtained. The incision was irrigated. A gram of vancomycin powder was placed into the incision. It was then closed with 2-0 vicryl, 3-0 nylon. A sterile dressing consisting of bacitracin, adaptic, 4x4s and a well padded short leg splint. She was then awoken from anesthesia and taken to PACU in stable condition.  Post Op Plan/Instructions: The patient will be nonweightbearing to the left lower extremity.  She will return to see me in approximately 2 weeks with repeat x-rays of her left ankle and plan for suture removal and either placement in a cast or boot. Aspirin 81 mg for DVT prophylaxis.  I was present and performed the entire surgery.  Truitt Merle, MD [facility]

## 2018-06-14 NOTE — Interval H&P Note (Signed)
History and Physical Interval Note:  06/14/2018 7:07 AM  Deborah Sawyer [name]  has presented today for surgery, with the diagnosis of left ankle fracture  The various methods of treatment have been discussed with the patient and family. After consideration of risks, benefits and other options for treatment, the patient has consented to  Procedure(s): OPEN REDUCTION INTERNAL FIXATION (ORIF) ANKLE FRACTURE (Left) as a surgical intervention .  The patient's history has been reviewed, patient examined, no change in status, stable for surgery.  I have reviewed the patient's chart and labs.  Questions were answered to the patient's satisfaction.     Caryn BeeKevin P Tnya Ades

## 2018-06-14 NOTE — Transfer of Care (Signed)
Immediate Anesthesia Transfer of Care Note  Patient: Deborah Sawyer  Procedure(s) Performed: OPEN REDUCTION INTERNAL FIXATION (ORIF) ANKLE FRACTURE (Left )  Patient Location: PACU  Anesthesia Type:GA combined with regional for post-op pain  Level of Consciousness: awake, alert , oriented and patient cooperative  Airway & Oxygen Therapy: Patient Spontanous Breathing and Patient connected to nasal cannula oxygen  Post-op Assessment: Report given to RN and Post -op Vital signs reviewed and stable  Post vital signs: Reviewed and stable  Last Vitals:  Vitals Value Taken Time  BP    Temp    Pulse    Resp    SpO2      Last Pain:  Vitals:   06/14/18 0638  TempSrc:   PainSc: 0-No pain         Complications: No apparent anesthesia complications

## 2018-06-14 NOTE — Anesthesia Procedure Notes (Signed)
Anesthesia Regional Block: Popliteal block   Pre-Anesthetic Checklist: ,, timeout performed, Correct Patient, Correct Site, Correct Laterality, Correct Procedure, Correct Position, site marked, Risks and benefits discussed,  Surgical consent,  Pre-op evaluation,  At surgeon's request and post-op pain management  Laterality: Left  Prep: Maximum Sterile Barrier Precautions used, chloraprep       Needles:  Injection technique: Single-shot  Needle Type: Echogenic Stimulator Needle     Needle Length: 9cm  Needle Gauge: 22     Additional Needles:   Procedures:,,,, ultrasound used (permanent image in chart),,,,  Narrative:  Start time: 06/14/2018 7:27 AM End time: 06/14/2018 7:35 AM Injection made incrementally with aspirations every 5 mL.  Performed by: Personally  Anesthesiologist: Elmer PickerWoodrum, Cynithia Hakimi L, MD  Additional Notes: Monitors applied. No increased pain on injection. No increased resistance to injection. Injection made in 5cc increments. Good needle visualization. Patient tolerated procedure well.

## 2018-06-14 NOTE — Anesthesia Procedure Notes (Signed)
Anesthesia Regional Block: Adductor canal block   Pre-Anesthetic Checklist: ,, timeout performed, Correct Patient, Correct Site, Correct Laterality, Correct Procedure, Correct Position, site marked, Risks and benefits discussed,  Surgical consent,  Pre-op evaluation,  At surgeon's request and post-op pain management  Laterality: Left  Prep: Maximum Sterile Barrier Precautions used, chloraprep       Needles:  Injection technique: Single-shot  Needle Type: Echogenic Stimulator Needle     Needle Length: 9cm  Needle Gauge: 22     Additional Needles:   Procedures:,,,, ultrasound used (permanent image in chart),,,,  Narrative:  Start time: 06/14/2018 7:23 AM End time: 06/14/2018 7:27 AM Injection made incrementally with aspirations every 5 mL.  Performed by: Personally  Anesthesiologist: Elmer PickerWoodrum, Lonnell Chaput L, MD  Additional Notes: Monitors applied. No increased pain on injection. No increased resistance to injection. Injection made in 5cc increments. Good needle visualization. Patient tolerated procedure well.

## 2018-06-17 ENCOUNTER — Encounter (HOSPITAL_COMMUNITY): Payer: Self-pay | Admitting: Student

## 2018-06-24 ENCOUNTER — Encounter: Payer: Self-pay | Admitting: Student

## 2018-06-24 DIAGNOSIS — S93432A Sprain of tibiofibular ligament of left ankle, initial encounter: Secondary | ICD-10-CM | POA: Insufficient documentation

## 2019-06-12 ENCOUNTER — Encounter (HOSPITAL_COMMUNITY): Payer: Self-pay

## 2019-06-12 ENCOUNTER — Other Ambulatory Visit: Payer: Self-pay

## 2019-06-12 ENCOUNTER — Ambulatory Visit (HOSPITAL_COMMUNITY)
Admission: EM | Admit: 2019-06-12 | Discharge: 2019-06-12 | Disposition: A | Discharge status: Home / Self Care | Payer: Medicaid Other | Attending: Family Medicine | Admitting: Family Medicine

## 2019-06-12 DIAGNOSIS — K0889 Other specified disorders of teeth and supporting structures: Secondary | ICD-10-CM | POA: Diagnosis not present

## 2019-06-12 DIAGNOSIS — R030 Elevated blood-pressure reading, without diagnosis of hypertension: Secondary | ICD-10-CM

## 2019-06-12 MED ORDER — PENICILLIN V POTASSIUM 500 MG PO TABS: 500.0000 mg | ORAL_TABLET | Freq: Three times a day (TID) | ORAL | 0 refills | Status: DC

## 2019-06-12 MED ORDER — IBUPROFEN 800 MG PO TABS: 800.0000 mg | ORAL_TABLET | Freq: Three times a day (TID) | ORAL | 0 refills | Status: DC

## 2019-06-12 NOTE — ED Triage Notes (Signed)
Pt states she has a toothache. Pt states her tooth is broken. Pt states she has some swelling in her gum. This has been going on for 3 days.

## 2019-06-12 NOTE — ED Provider Notes (Signed)
[facility]   [id number] 06/12/19 Arrival Time: 0932  ASSESSMENT & PLAN:  1. Pain, dental     No sign of dental/oral abscess requiring I&D at this time. Discussed.  Meds ordered this encounter  Medications  . penicillin v potassium (VEETID) 500 MG tablet    Sig: Take 1 tablet (500 mg total) by mouth 3 (three) times daily.    Dispense:  30 tablet    Refill:  0  . ibuprofen (ADVIL) 800 MG tablet    Sig: Take 1 tablet (800 mg total) by mouth 3 (three) times daily with meals.    Dispense:  21 tablet    Refill:  0   Dental resource written instructions given. She will schedule dental evaluation as soon as possible if not improving over the next 24-48 hours.  Encouraged to est care with PCP re: BP. May return here at anytime for BP recheck.  Reviewed expectations re: course of current medical issues. Questions answered. Outlined signs and symptoms indicating need for more acute intervention. Patient verbalized understanding. After Visit Summary given.   SUBJECTIVE:  Deborah Sawyer is a 33 y.o. female who reports gradual onset of right lower dental pain described as aching/throbbing. Present for approx 3 days. Fever: absent. Tolerating PO intake but reports pain with chewing. Normal swallowing. She does not see a dentist regularly. No neck swelling or pain. OTC analgesics without relief.  Increased blood pressure noted today. Reports that she has not been treated for hypertension in the past.  She reports no chest pain on exertion, no dyspnea on exertion, no swelling of ankles, no orthostatic dizziness or lightheadedness, no orthopnea or paroxysmal nocturnal dyspnea, no palpitations and no intermittent claudication symptoms.  ROS: As per HPI. All other systems negative.   OBJECTIVE: Vitals:   06/12/19 1001 06/12/19 1002  BP: (!) 148/91   Pulse: 84   Resp: 18   Temp: 98.2 F (36.8 C)   TempSrc: Oral   SpO2: 98%   Weight:  74.8 kg    General appearance: alert;  no distress HENT: normocephalic; atraumatic; dentition: fair; right lower gums without areas of fluctuance, drainage, or bleeding and with significant tenderness to palpation; normal jaw movement without difficulty; multiple fillings Neck: supple without LAD; FROM; trachea midline CV: RRR Lungs: normal respirations; unlabored Skin: warm and dry Ext: no LE edema Psychological: alert and cooperative; normal mood and affect  No Known Allergies  Past Medical History:  Diagnosis Date  . Anemia    during 1st pregnancy  . Hernia of abdominal cavity age 89  . Hx of lumpectomy age 33   Social History   Socioeconomic History  . Marital status: Single    Spouse name: Not on file  . Number of children: Not on file  . Years of education: Not on file  . Highest education level: Not on file  Occupational History  . Not on file  Social Needs  . Financial resource strain: Not on file  . Food insecurity    Worry: Not on file    Inability: Not on file  . Transportation needs    Medical: Not on file    Non-medical: Not on file  Tobacco Use  . Smoking status: Former Smoker    Types: Cigarettes    Quit date: 06/23/2017    Years since quitting: 1.9  . Smokeless tobacco: Never Used  Substance and Sexual Activity  . Alcohol use: No  . Drug use: No  . Sexual activity: Yes  Birth control/protection: None  Lifestyle  . Physical activity    Days per week: Not on file    Minutes per session: Not on file  . Stress: Not on file  Relationships  . Social Herbalist on phone: Not on file    Gets together: Not on file    Attends religious service: Not on file    Active member of club or organization: Not on file    Attends meetings of clubs or organizations: Not on file    Relationship status: Not on file  . Intimate partner violence    Fear of current or ex partner: Not on file    Emotionally abused: Not on file    Physically abused: Not on file    Forced sexual activity: Not on  file  Other Topics Concern  . Not on file  Social History Narrative  . Not on file   FH: HTN  Past Surgical History:  Procedure Laterality Date  . BREAST LUMPECTOMY Left    benign, at age 19  . HERNIA REPAIR    . ORIF ANKLE FRACTURE Left 06/14/2018   Procedure: OPEN REDUCTION INTERNAL FIXATION (ORIF) ANKLE FRACTURE;  Surgeon: Shona Needles, MD;  Location: [place] [facility];  Service: Orthopedics;  Laterality: Left;     Vanessa Kick, MD 06/12/19 1020

## 2019-06-12 NOTE — Discharge Instructions (Signed)
Your blood pressure was noted to be elevated during your visit today. You may return here within the next few days to recheck if unable to see a primary care doctor.

## 2019-09-14 IMAGING — US US OB COMP LESS 14 WK
1 series · 15 of 28 positions shown · non-contrast
Comparison: None.

CLINICAL DATA: 31-year-old pregnant female presents for assessment
fetal viability and dating. No acute symptoms are reported.

EDC by LMP: 03/02/2018, projecting to expected gestational age of 13
weeks 5 days.
EXAM:
OBSTETRIC <14 WK ULTRASOUND
TECHNIQUE: Transabdominal ultrasound was performed for evaluation of the
gestation as well as the maternal uterus and adnexal regions.

[Series 1: us ob comp less 14 wk · 15 of 35 slices shown]
[im 1/35]
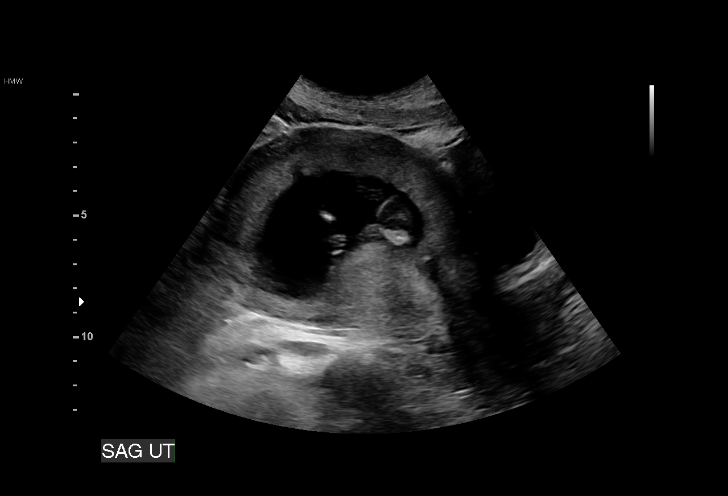
[im 3/35]
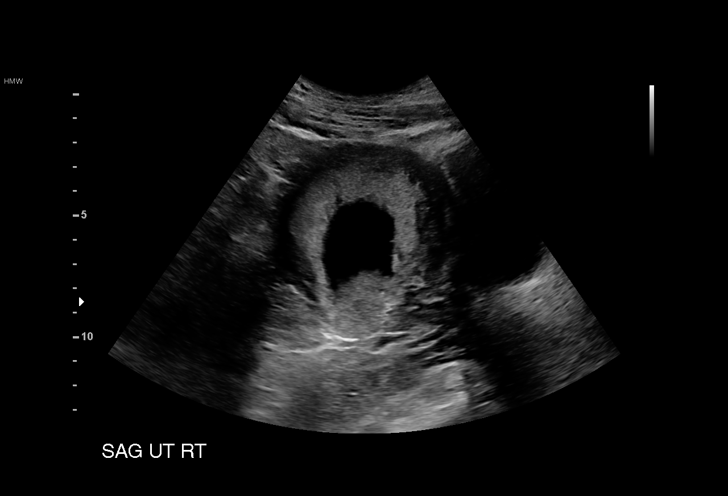
[im 6/35]
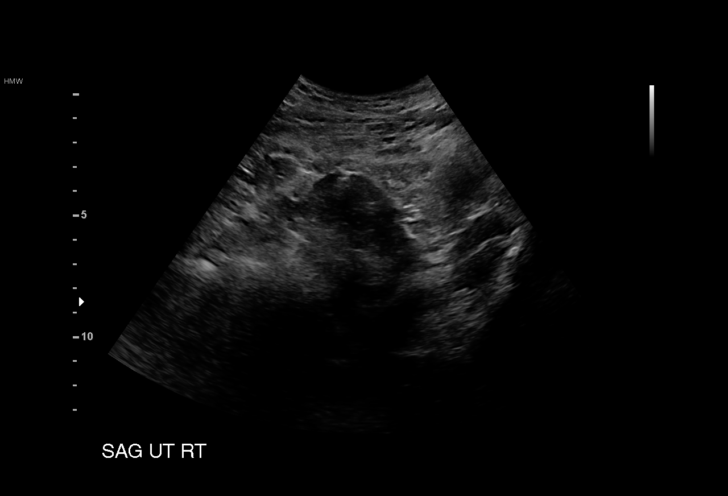
[im 8/35]
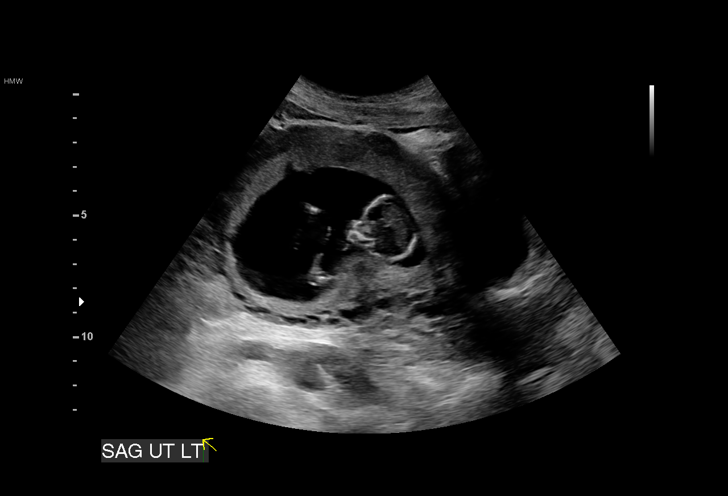
[im 11/35]
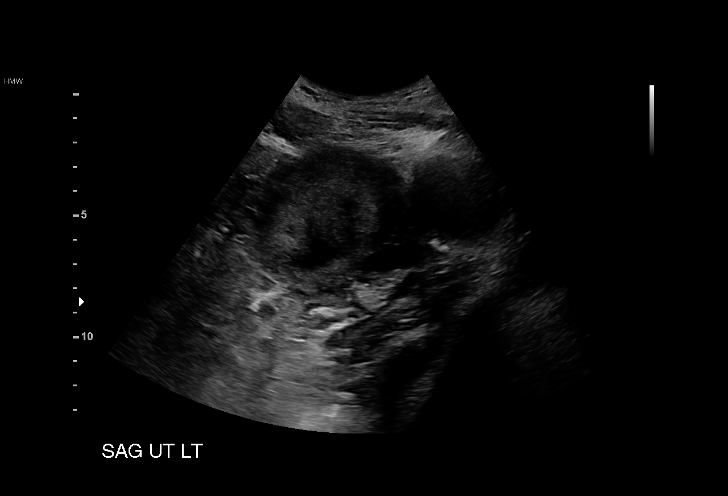
[im 13/35]
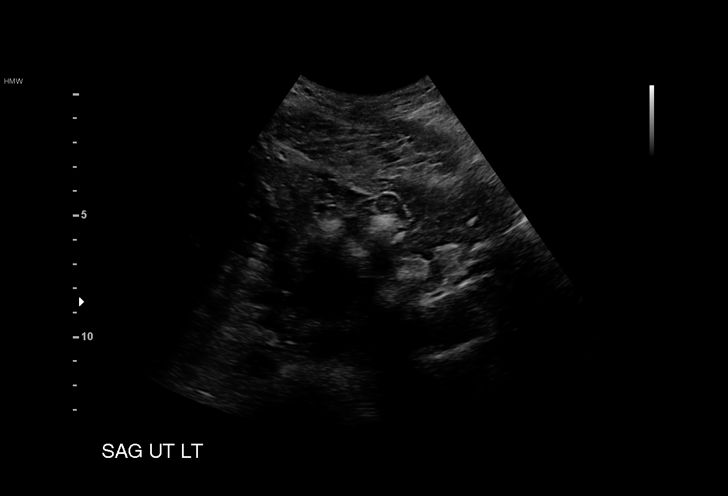
[im 16/35]
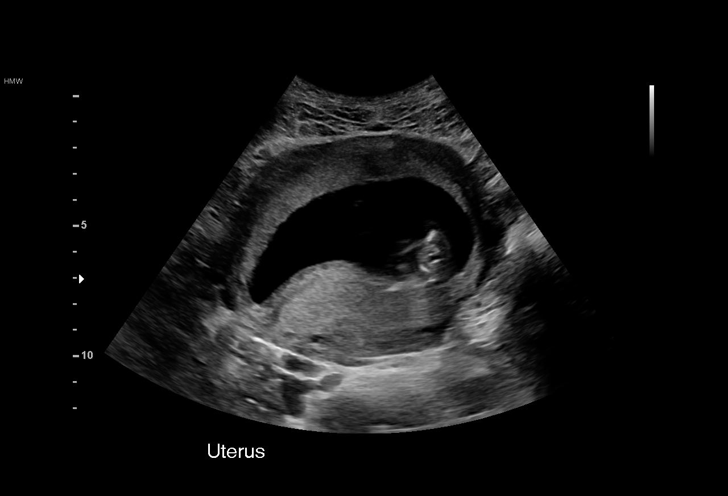
[im 18/35]
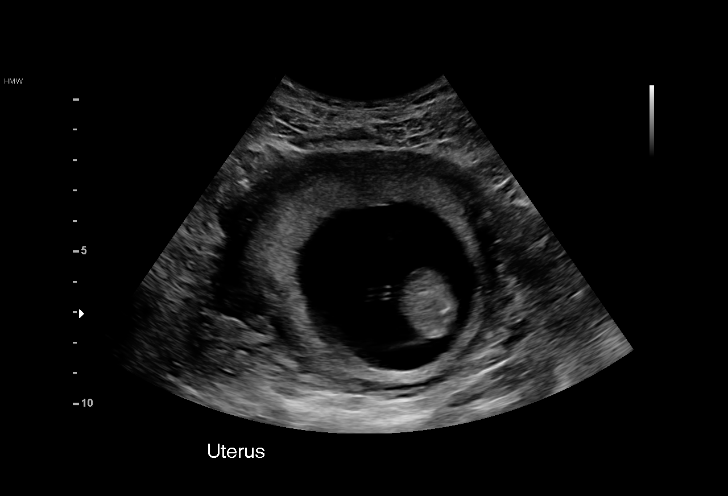
[im 19/35]
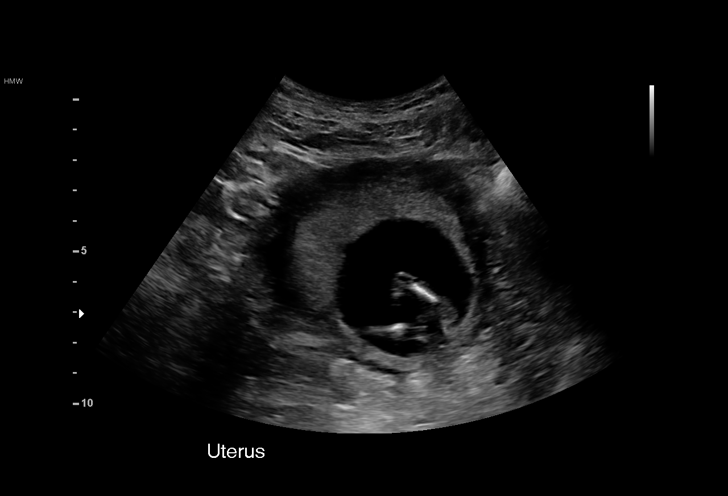
[im 22/35]
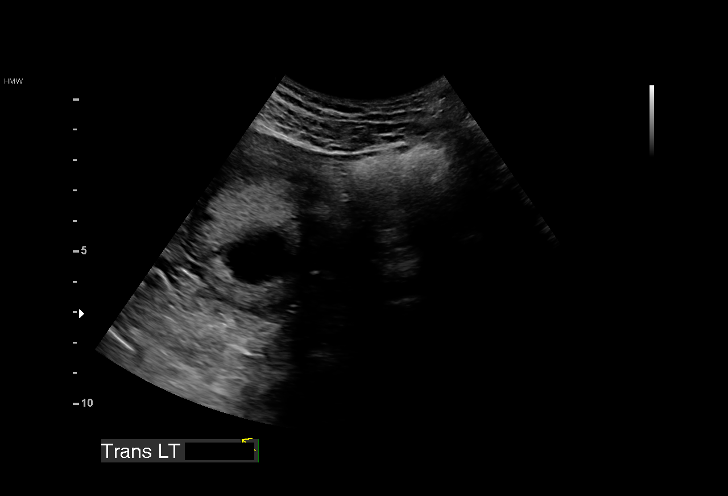
[im 24/35]
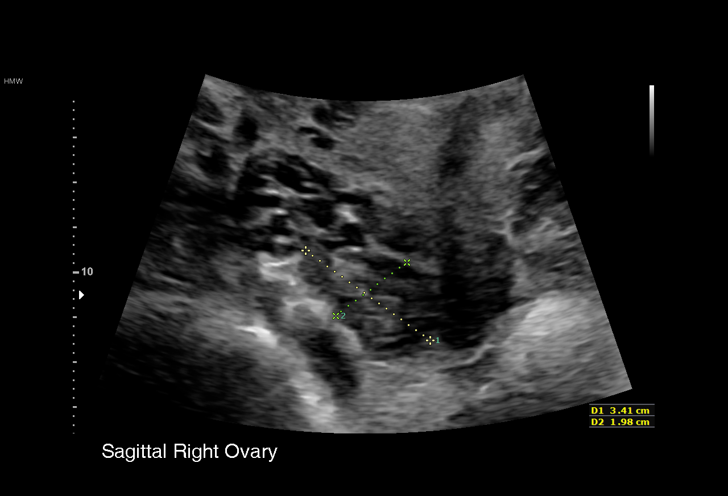
[im 27/35]
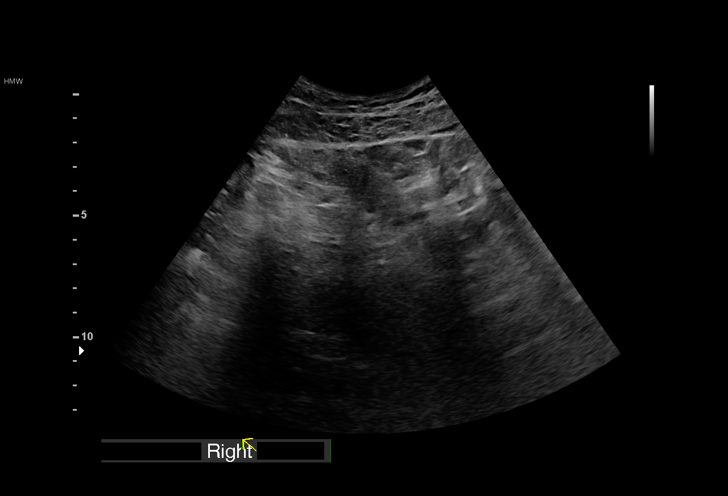
[im 29/35]
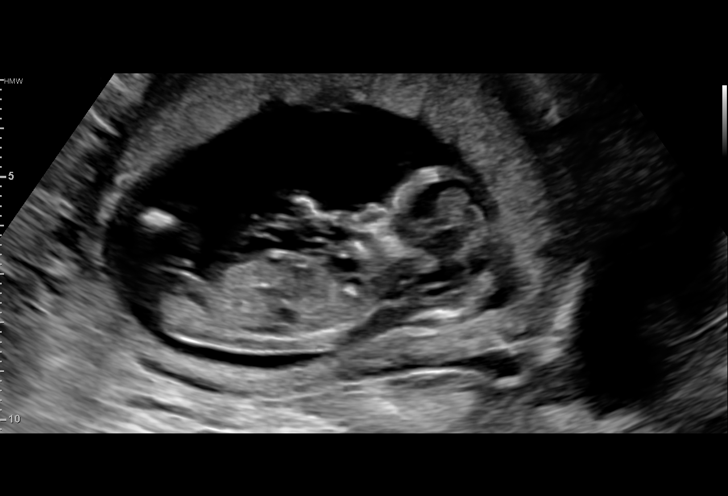
[im 32/35]
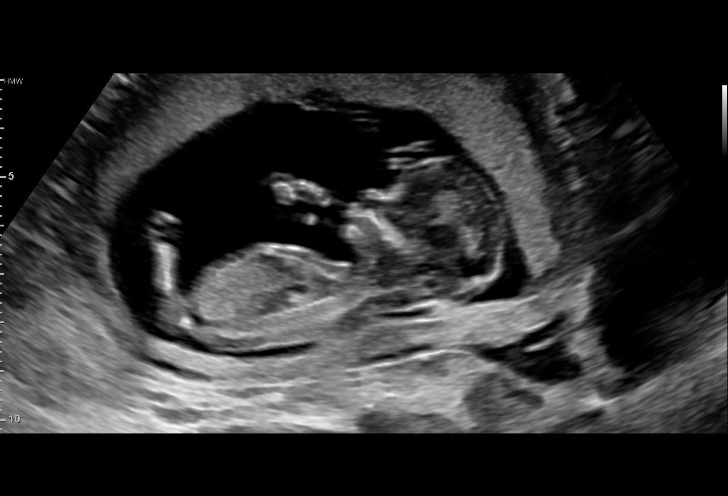
[im 35/35]
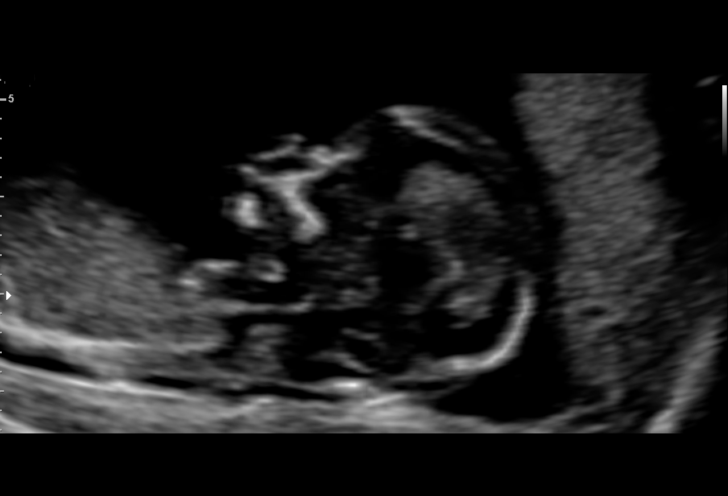

[15 of 28 positions shown; findings below may reference images not displayed]

FINDINGS: Intrauterine gestational sac: Single intrauterine gestational sac
appears normal in size, shape and position.

Yolk sac:  Not Visualized.

Fetus: Visualized. Fetal anatomy not assessed at this early
gestational age.

Fetal Cardiac Activity: Regular rate and rhythm

Fetal Heart Rate: 140 bpm

CRL:   74.6  mm   13 w 4 d                  US EDC: 03/03/2018

Subchorionic hemorrhage:  None visualized.

Maternal uterus/adnexae: Right ovary measures 3.4 x 2.0 x 2.7 cm and
appears normal. Left ovary is not visualized. No adnexal masses. No
uterine fibroids are demonstrated. No abnormal free fluid in the
pelvis.
IMPRESSION: 1. Single living intrauterine gestation at 13 weeks 4 days by
crown-rump length, concordant with provided menstrual dating. Normal
fetal cardiac activity .
2. No acute first-trimester gestational abnormality.

## 2019-10-24 IMAGING — US US MFM OB COMP +14 WKS
1 series · 14 of 28 positions shown · non-contrast
Comparison: none

[Series 1: us mfm ob comp +14 wks · 73 acquisitions, 14 frames shown]
[im 3/73]
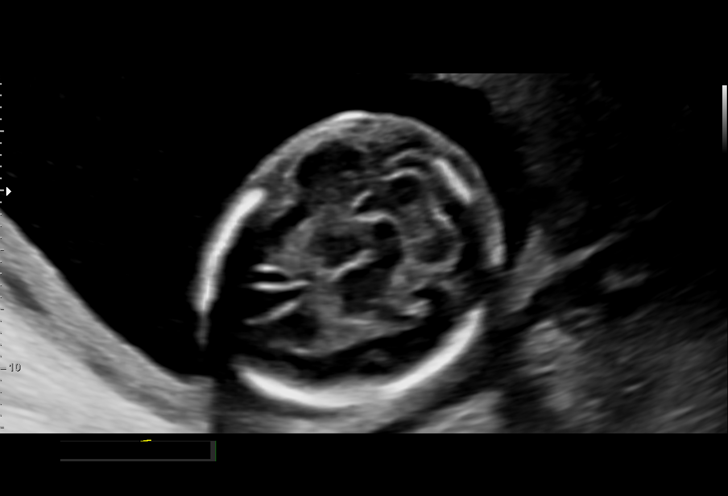
[im 9/73]
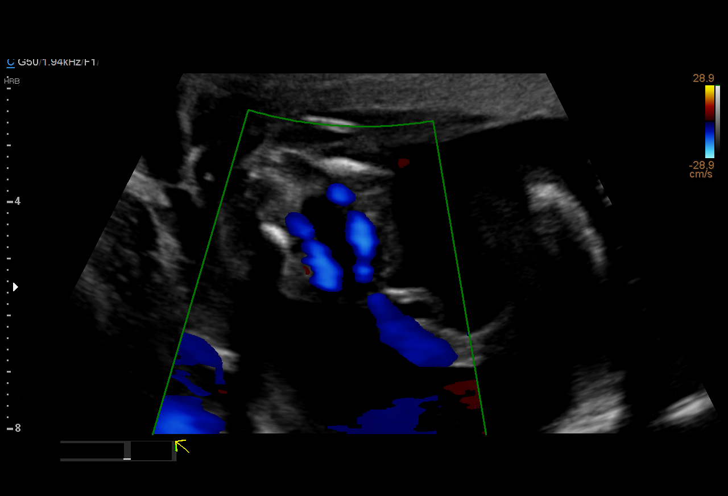
[im 14/73]
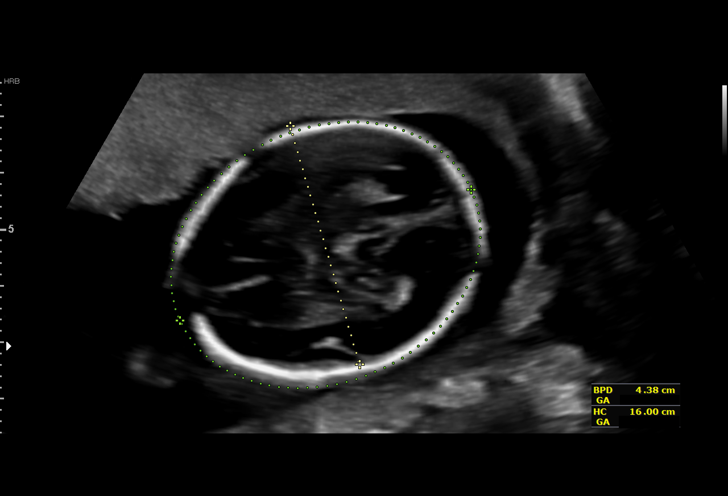
[im 19/73]
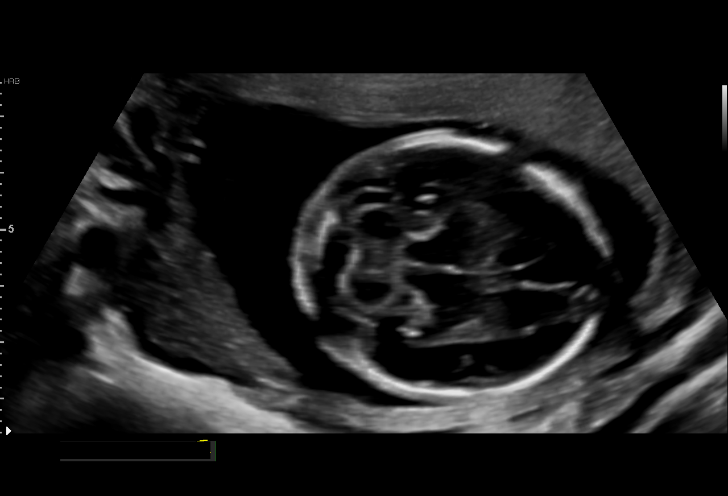
[im 25/73]
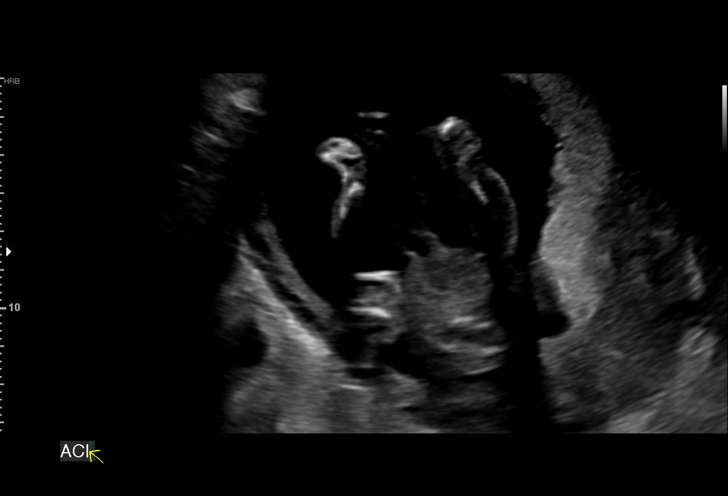
[im 30/73]
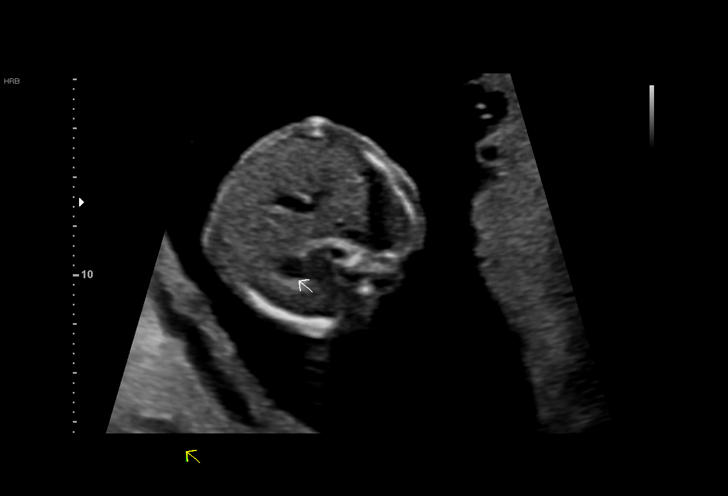
[im 35/73]
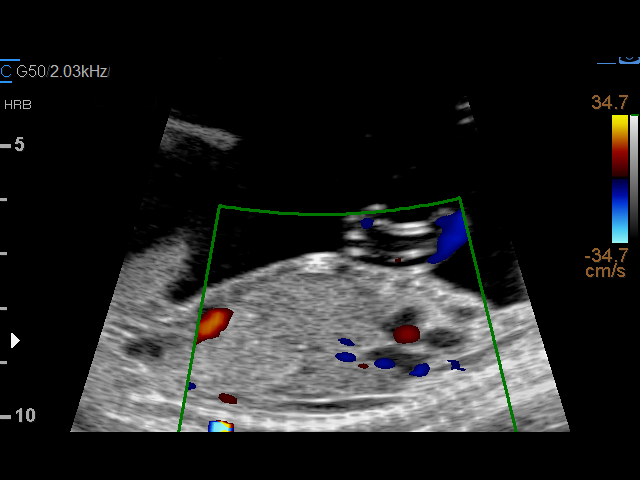
[im 41/73]
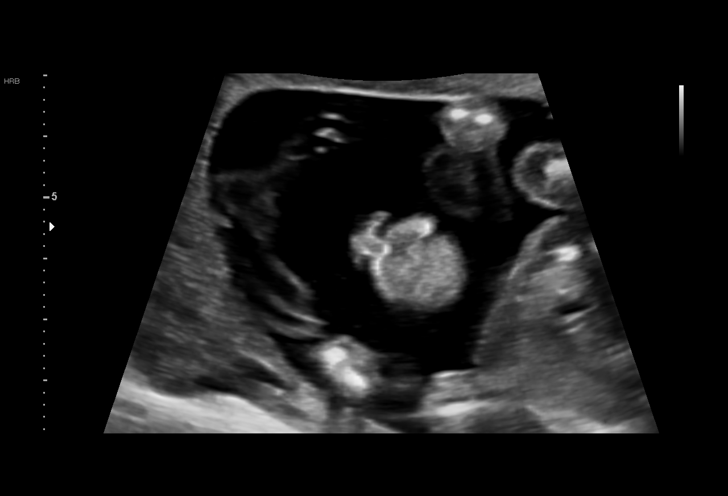
[im 46/73]
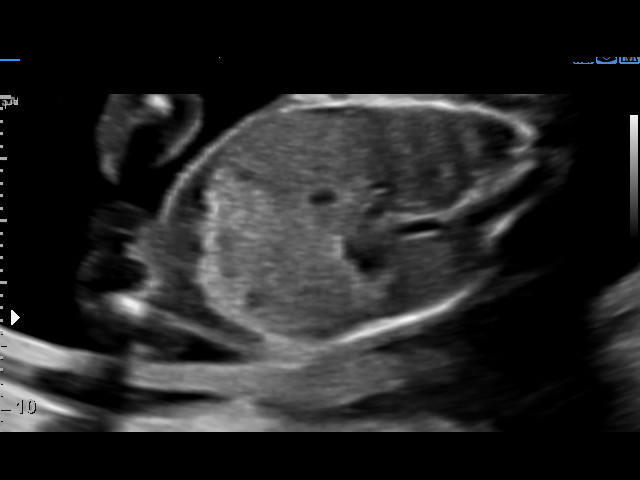
[im 51/73]
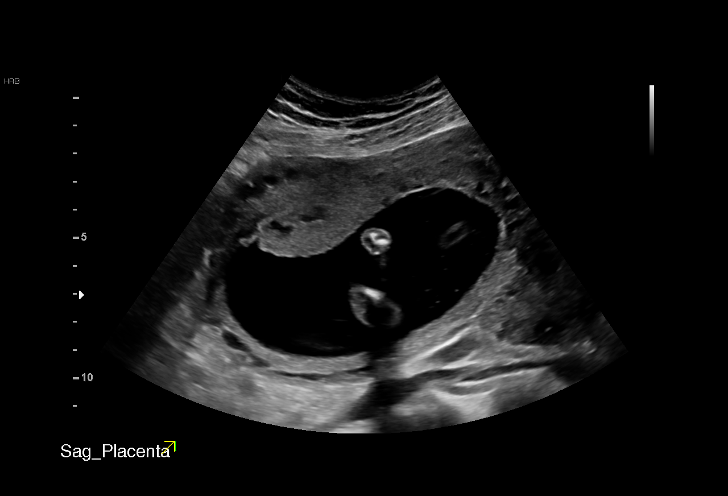
[im 57/73]
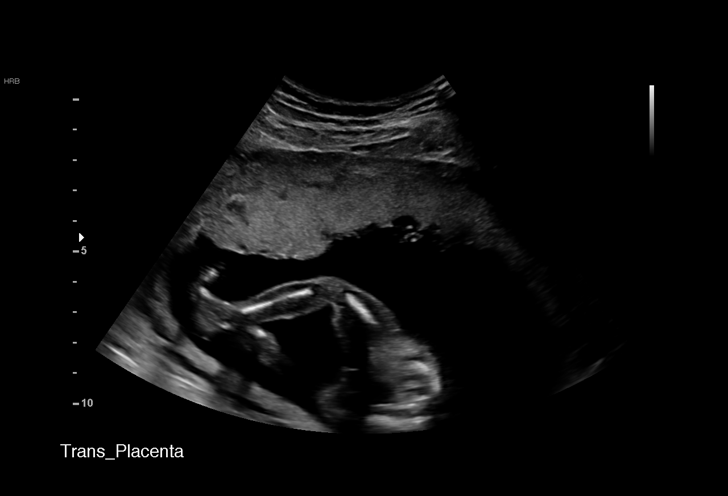
[im 62/73]
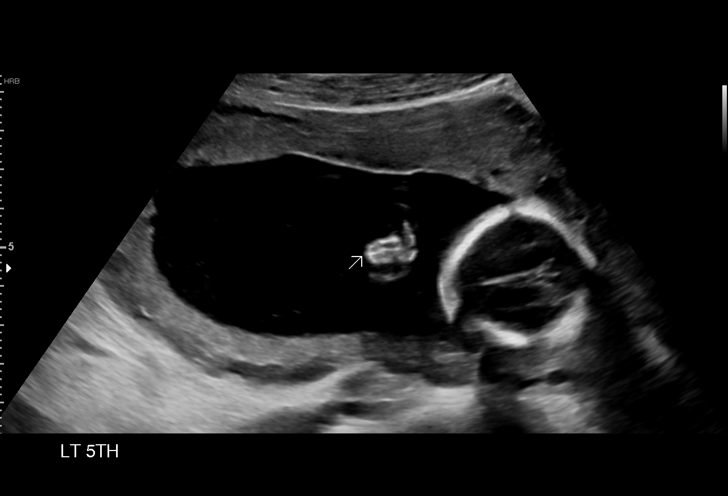
[im 67/73]
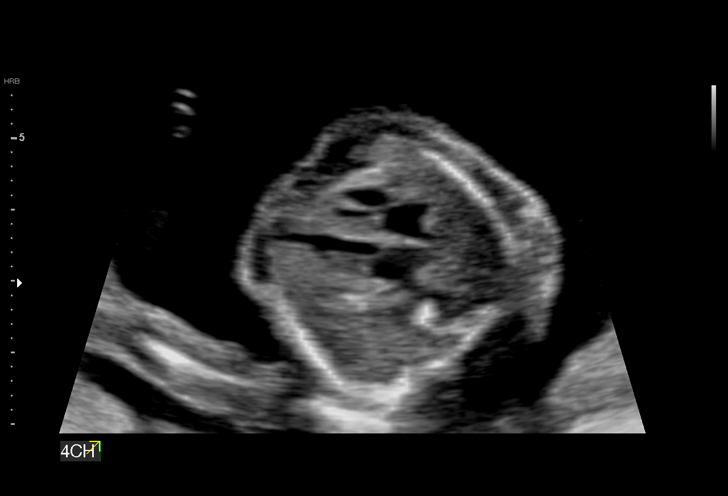
[im 73/73]
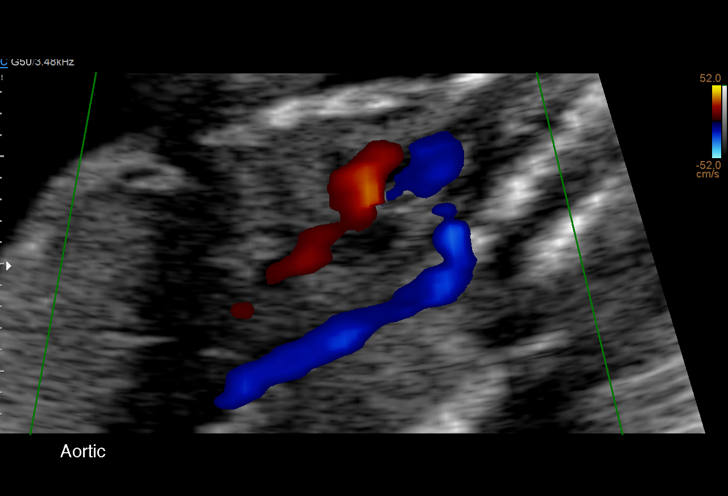

[14 of 28 positions shown; findings below may reference images not displayed]

[facility] [HOSPITAL]

1  [facility]ELLE [facility]BEL         [id number]       [id number]     [id number]
Indications

19 weeks gestation of pregnancy
Encounter for fetal anatomic survey
OB History

Blood Type:            Height:  5'2"   Weight (lb):  155       BMI:
Gravidity:    2         Term:   1
Living:       1
Fetal Evaluation

Num Of Fetuses:     1
Fetal Heart         140
Rate(bpm):
Cardiac Activity:   Observed
Presentation:       Breech
Placenta:           Anterior, above cervical os
P. Cord Insertion:  Visualized, central

Amniotic Fluid
AFI FV:      Subjectively within normal limits

Largest Pocket(cm)
5.35
Biometry

BPD:      44.4  mm     G. Age:  19w 3d         50  %    CI:        77.03   %    70 - 86
FL/HC:      17.7   %    16.1 -
HC:      160.2  mm     G. Age:  18w 6d         19  %    HC/AC:      1.17        1.09 -
AC:      136.7  mm     G. Age:  19w 0d         35  %    FL/BPD:     63.7   %
FL:       28.3  mm     G. Age:  18w 5d         19  %    FL/AC:      20.7   %    20 - 24
HUM:      28.7  mm     G. Age:  19w 2d         48  %
CER:      18.1  mm     G. Age:  18w 0d          9  %
NFT:       3.5  mm
CM:        2.7  mm

Est. FW:     264  gm      0 lb 9 oz     36  %
Gestational Age

LMP:           19w 3d        Date:  05/26/17                 EDD:   03/02/18
U/S Today:     19w 0d                                        EDD:   03/05/18
Best:          19w 3d     Det. By:  LMP  (05/26/17)          EDD:   03/02/18
Anatomy

Cranium:               Appears normal         Aortic Arch:            Appears normal
Cavum:                 Appears normal         Ductal Arch:            Not well visualized
Ventricles:            Appears normal         Diaphragm:              Appears normal
Choroid Plexus:        Appears normal         Stomach:                Appears normal, left
sided
Cerebellum:            Appears normal         Abdomen:                Appears normal
Posterior Fossa:       Appears normal         Abdominal Wall:         Appears nml (cord
insert, abd wall)
Nuchal Fold:           Appears normal         Cord Vessels:           Appears normal (3
vessel cord)
Face:                  Appears normal         Kidneys:                Appear normal
(orbits and profile)
Lips:                  Appears normal         Bladder:                Appears normal
Thoracic:              Appears normal         Spine:                  Appears normal
Heart:                 Appears normal         Upper Extremities:      Appears normal
(4CH, axis, and situs
RVOT:                  Appears normal         Lower Extremities:      Appears normal
LVOT:                  Appears normal

Other:  Male gender. Heels and 5th digit visualized. Nasal bone visualized.
Technically difficult due to fetal position.
Cervix Uterus Adnexa

Cervix
Length:           3.57  cm.
Normal appearance by transabdominal scan.

Uterus
No abnormality visualized.

Adnexa:       No abnormality visualized.
Impression

SIUP at 19+3 weeks with cardiac activity
Normal detailed fetal anatomy; limited views of the DA
Markers of aneuploidy: none
Normal amniotic fluid volume
Measurements consistent with LMP dating
Recommendations

Follow-up as clinically indicated

## 2020-06-28 IMAGING — RF DG C-ARM 61-120 MIN
1 series · 8 of 8 positions shown · non-contrast
Comparison: 06/07/2018

FLUOROSCOPY TIME:  0 minutes 47 seconds

Images submitted: 8

CLINICAL DATA: LEFT ankle fractures, ORIF

EXAM:
LEFT ANKLE COMPLETE - 3+ VIEW; DG C-ARM 61-120 MIN

[Series 1: run · 8 of 8 slices shown]
[im 1/8]
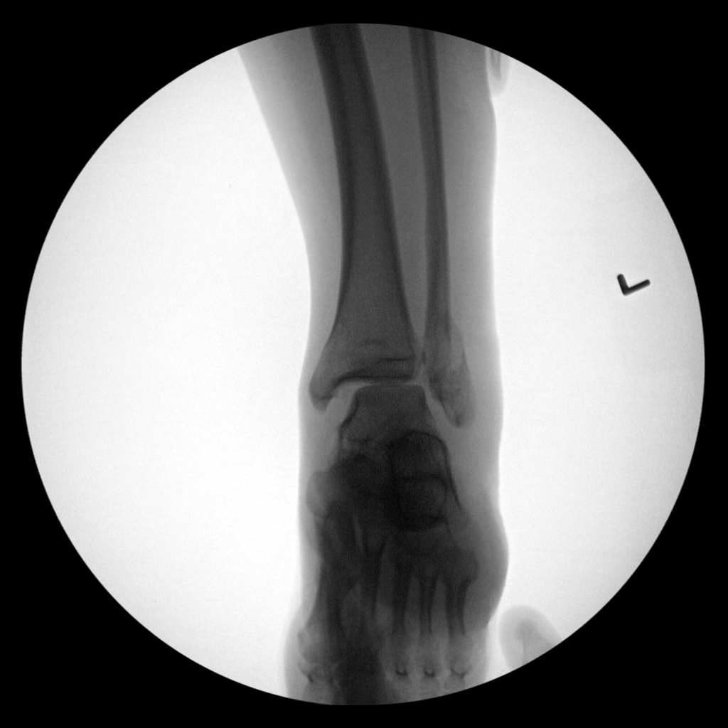
[im 2/8]
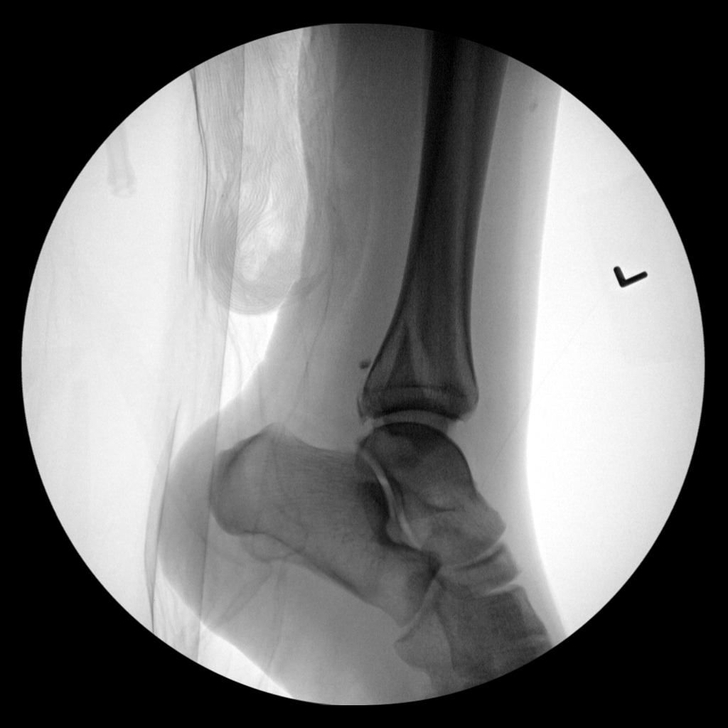
[im 3/8]
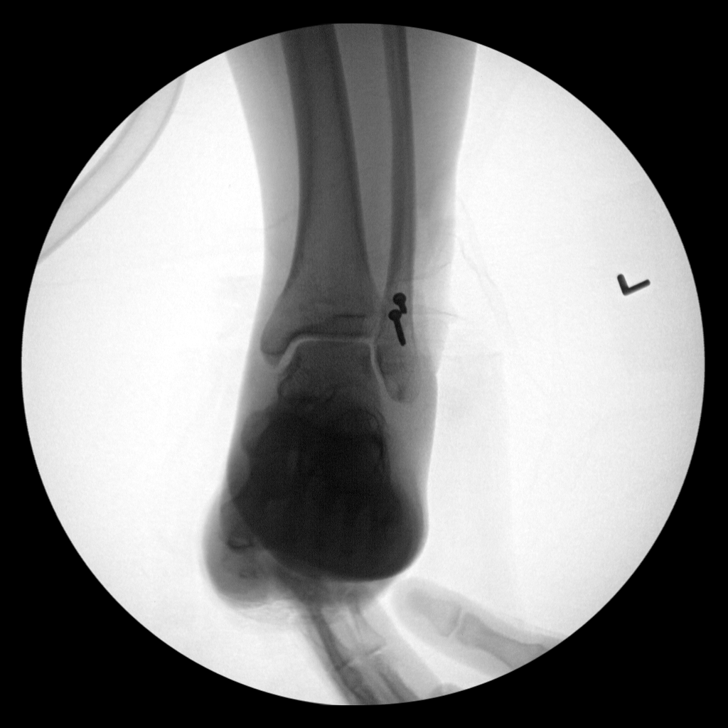
[im 4/8]
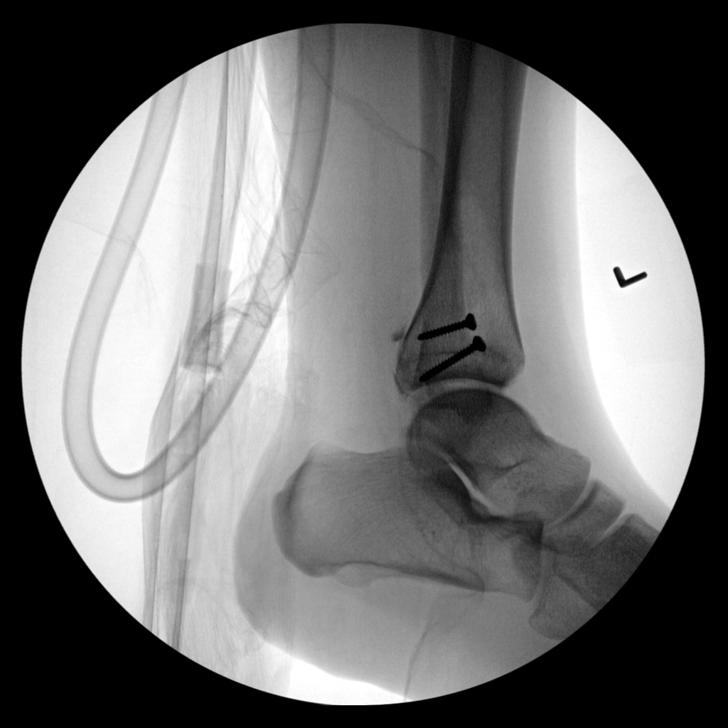
[im 5/8]
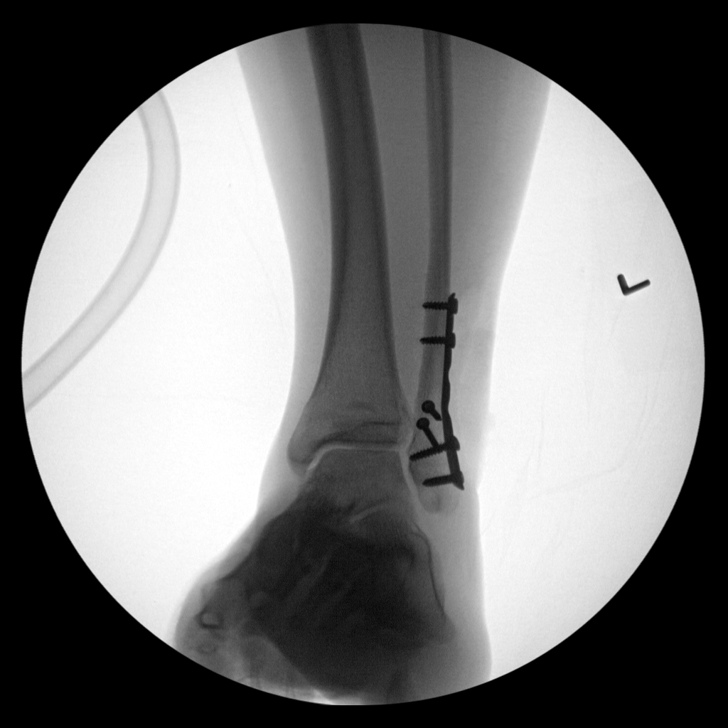
[im 6/8]
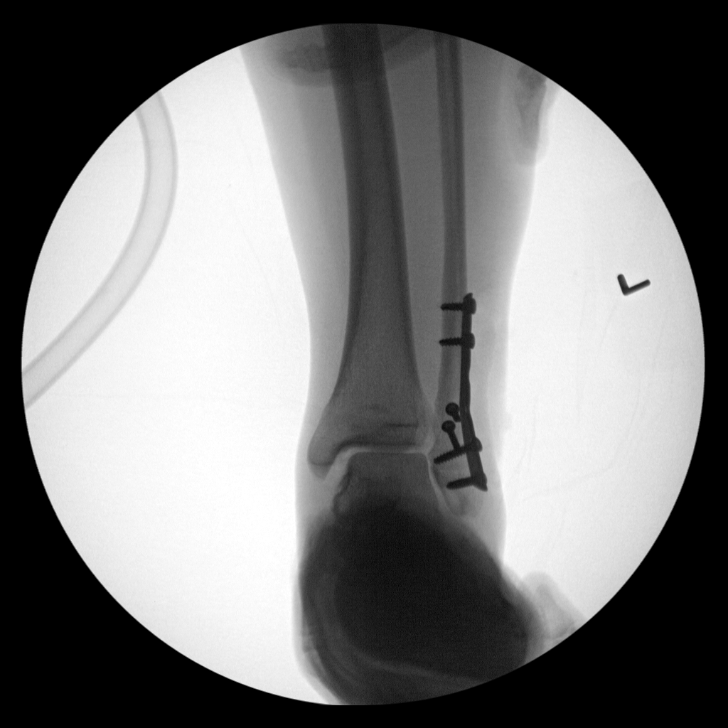
[im 7/8]
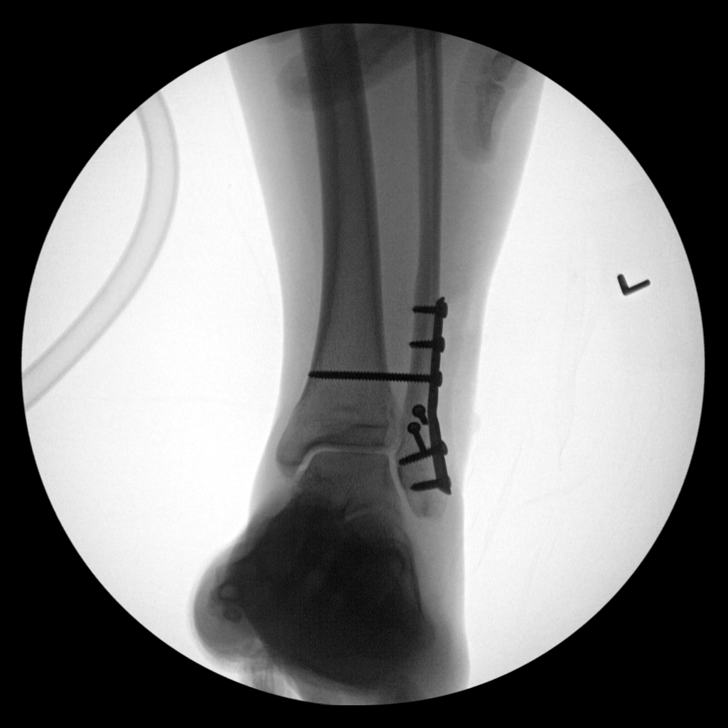
[im 8/8]
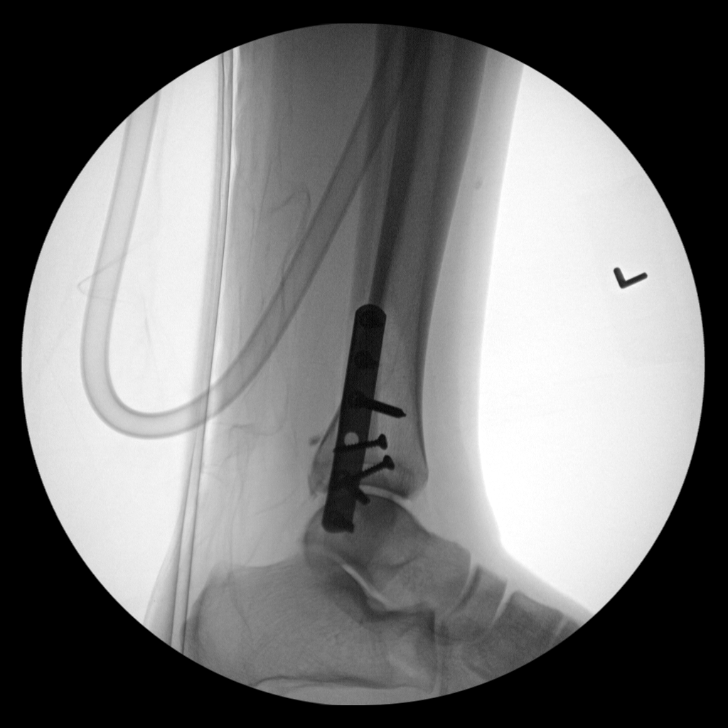

[8 of 8 positions shown; findings below may reference images not displayed]

FINDINGS: Initial image demonstrates at a mildly displaced oblique fracture of
lateral malleolus and widening of the medial joint line.

Additional small posterior malleolar fracture fragment identified.

Subsequent images demonstrate placement of a lateral plate and
multiple screws at the distal fibula post ORIF of a reduced lateral
malleolar fracture.

Lateral to medial syndesmotic screw present.

Widening at the medial joint line appears resolved on the final
images.
IMPRESSION: Post ORIF LEFT ankle fractures as above.

## 2022-02-03 ENCOUNTER — Ambulatory Visit (HOSPITAL_COMMUNITY): Payer: Self-pay

## 2022-02-04 ENCOUNTER — Ambulatory Visit (HOSPITAL_COMMUNITY)
Admission: RE | Admit: 2022-02-04 | Discharge: 2022-02-04 | Disposition: A | Discharge status: Home / Self Care | Payer: Medicaid Other | Source: Ambulatory Visit | Attending: Emergency Medicine | Admitting: Emergency Medicine

## 2022-02-04 ENCOUNTER — Encounter (HOSPITAL_COMMUNITY): Payer: Self-pay

## 2022-02-04 VITALS — BP 106/70 | HR 63 | Temp 97.9°F | Resp 16

## 2022-02-04 DIAGNOSIS — Z202 Contact with and (suspected) exposure to infections with a predominantly sexual mode of transmission: Secondary | ICD-10-CM | POA: Diagnosis present

## 2022-02-04 MED ORDER — DOXYCYCLINE HYCLATE 100 MG PO CAPS: 100.0000 mg | ORAL_CAPSULE | Freq: Two times a day (BID) | ORAL | 0 refills | Status: DC

## 2022-02-04 NOTE — Discharge Instructions (Signed)
Today you are being treated prophylactically for chlamydia ? ?Take doxycycline twice daily for the next 7 days ? ? ?Labs pending 2-3 days, you will be contacted if positive for any sti and treatment will be sent to the pharmacy, you will have to return to the clinic if positive for gonorrhea to receive treatment  ? ?Please refrain from having sex until labs results, if positive please refrain from having sex until treatment complete and symptoms resolve  ? ?If positive for  Chlamydia  gonorrhea or trichomoniasis please notify partner or partners so they may tested as well ? ?Moving forward, it is recommended you use some form of protection against the transmission of sti infections  such as condoms or dental dams with each sexual encounter  ? ?

## 2022-02-04 NOTE — ED Provider Notes (Signed)
?[facility] ? ? ? ?CSN: [id number] ?Arrival date & time: 02/04/22  1018 ? ? ?  ? ?History   ?Chief Complaint ?Chief Complaint  ?Patient presents with  ? Exposure to STD  ?  Entered by patient  ? appt 1030  ? ? ?HPI ?Deborah Sawyer is a 36 y.o. female.  ? ?Patient presents requesting STD testing due to possible exposure to chlamydia.  Was told by third-party source that one of her partners tested positive.  Denies all symptoms.  Sexually active, 2 partners, sometimes condom use.  ? ?Past Medical History:  ?Diagnosis Date  ? Anemia   ? during 1st pregnancy  ? Hernia of abdominal cavity age 47  ? Hx of lumpectomy age 18  ? ? ?Patient Active Problem List  ? Diagnosis Date Noted  ? Ankle syndesmosis disruption, left, initial encounter 06/24/2018  ? Bimalleolar ankle fracture, left, closed, initial encounter 06/11/2018  ? Trichomoniasis 08/20/2017  ? ? ?Past Surgical History:  ?Procedure Laterality Date  ? BREAST LUMPECTOMY Left   ? benign, at age 40  ? HERNIA REPAIR    ? ORIF ANKLE FRACTURE Left 06/14/2018  ? Procedure: OPEN REDUCTION INTERNAL FIXATION (ORIF) ANKLE FRACTURE;  Surgeon: Roby Lofts, MD;  Location: [facility];  Service: Orthopedics;  Laterality: Left;  ? ? ?OB History   ? ? Gravida  ?2  ? Para  ?2  ? Term  ?2  ? Preterm  ?   ? AB  ?   ? Living  ?2  ?  ? ? SAB  ?   ? IAB  ?   ? Ectopic  ?   ? Multiple  ?0  ? Live Births  ?2  ?   ?  ?  ? ? ? ?Home Medications   ? ?Prior to Admission medications   ?Medication Sig Start Date End Date Taking? Authorizing Provider  ?HYDROcodone-acetaminophen (NORCO) 5-325 MG tablet Take 1 tablet by mouth every 6 (six) hours as needed for moderate pain. 06/14/18   Haddix, Gillie Manners, MD  ?ibuprofen (ADVIL) 800 MG tablet Take 1 tablet (800 mg total) by mouth 3 (three) times daily with meals. 06/12/19   Mardella Layman, MD  ?penicillin v potassium (VEETID) 500 MG tablet Take 1 tablet (500 mg total) by mouth 3 (three) times daily. 06/12/19   Mardella Layman, MD  ?Prenatal  Multivit-Min-Fe-FA (PRENATAL VITAMINS PO) Take 1 tablet by mouth daily.     [provider]  ? ? ?Family History ?No family history on file. ? ?Social History ?Social History  ? ?Tobacco Use  ? Smoking status: Former  ?  Types: Cigarettes  ?  Quit date: 06/23/2017  ?  Years since quitting: 4.6  ? Smokeless tobacco: Never  ?Vaping Use  ? Vaping Use: Never used  ?Substance Use Topics  ? Alcohol use: No  ? Drug use: No  ? ? ? ?Allergies   ?Patient has no known allergies. ? ? ?Review of Systems ?Review of Systems  ?Constitutional: Negative.   ?Respiratory: Negative.    ?Cardiovascular: Negative.   ?Genitourinary: Negative.   ? ? ?Physical Exam ?Triage Vital Signs ?ED Triage Vitals  ?Enc Vitals Group  ?   BP 02/04/22 1039 106/70  ?   Pulse Rate 02/04/22 1039 63  ?   Resp 02/04/22 1039 16  ?   Temp 02/04/22 1039 97.9 ?F (36.6 ?C)  ?   Temp src --   ?   SpO2 02/04/22 1039 97 %  ?  Weight --   ?   Height --   ?   Head Circumference --   ?   Peak Flow --   ?   Pain Score 02/04/22 1037 0  ?   Pain Loc --   ?   Pain Edu? --   ?   Excl. in GC? --   ? ?No data found. ? ?Updated Vital Signs ?BP 106/70 (BP Location: Left Arm)   Pulse 63   Temp 97.9 ?F (36.6 ?C)   Resp 16   LMP 01/22/2022   SpO2 97%  ? ?Visual Acuity ?Right Eye Distance:   ?Left Eye Distance:   ?Bilateral Distance:   ? ?Right Eye Near:   ?Left Eye Near:    ?Bilateral Near:    ? ?Physical Exam ?Constitutional:   ?   Appearance: Normal appearance.  ?Eyes:  ?   Extraocular Movements: Extraocular movements intact.  ?Pulmonary:  ?   Effort: Pulmonary effort is normal.  ?Genitourinary: ?   Comments: Deferred, self collect vaginal swab ?Skin: ?   General: Skin is warm and dry.  ?Neurological:  ?   Mental Status: She is alert and oriented to person, place, and time. Mental status is at baseline.  ?Psychiatric:     ?   Mood and Affect: Mood normal.     ?   Behavior: Behavior normal.  ? ? ? ?UC Treatments / Results  ?Labs ?(all labs ordered are listed, but only  abnormal results are displayed) ?Labs Reviewed  ?CERVICOVAGINAL ANCILLARY ONLY  ? ? ?EKG ? ? ?Radiology ?No results found. ? ?Procedures ?Procedures (including critical care time) ? ?Medications Ordered in UC ?Medications - No data to display ? ?Initial Impression / Assessment and Plan / UC Course  ?I have reviewed the triage vital signs and the nursing notes. ? ?Pertinent labs & imaging results that were available during my care of the patient were reviewed by me and considered in my medical decision making (see chart for details). ? ?Exposure to STD ? ?Labs pending, will treat per protocol, will treat prophylactically for chlamydia, doxycycline 7-day course prescribed, advised abstinence until all medication is complete and labs have resulted, may follow-up with urgent care as needed ? ?Final Clinical Impressions(s) / UC Diagnoses  ? ?Final diagnoses:  ?None  ? ?Discharge Instructions   ?None ?  ? ?ED Prescriptions   ?None ?  ? ?PDMP not reviewed this encounter. ?  ?Valinda Hoar, NP ?02/04/22 1059 ? ?

## 2022-02-04 NOTE — ED Triage Notes (Signed)
Pt heard from a source that one of her sexual partners has chlamydia and wants STD testing. Denies s/s.  ?

## 2022-02-06 LAB — CERVICOVAGINAL ANCILLARY ONLY
Bacterial Vaginitis (gardnerella): POSITIVE — AB
Candida Glabrata: NEGATIVE
Candida Vaginitis: POSITIVE — AB
Chlamydia: NEGATIVE
Comment: NEGATIVE
Comment: NEGATIVE
Comment: NEGATIVE
Comment: NEGATIVE
Comment: NEGATIVE
Comment: NORMAL
Neisseria Gonorrhea: NEGATIVE
Trichomonas: NEGATIVE

## 2022-02-07 ENCOUNTER — Telehealth (HOSPITAL_COMMUNITY): Payer: Self-pay | Admitting: Emergency Medicine

## 2022-02-07 MED ORDER — METRONIDAZOLE 500 MG PO TABS: 500.0000 mg | ORAL_TABLET | Freq: Two times a day (BID) | ORAL | 0 refills | Status: DC

## 2022-02-07 MED ORDER — FLUCONAZOLE 150 MG PO TABS: 150.0000 mg | ORAL_TABLET | Freq: Once | ORAL | 0 refills | Status: AC

## 2023-12-06 ENCOUNTER — Ambulatory Visit (HOSPITAL_COMMUNITY)
Admission: RE | Admit: 2023-12-06 | Discharge: 2023-12-06 | Disposition: A | Discharge status: Home / Self Care | Payer: Medicaid Other | Source: Ambulatory Visit | Attending: Emergency Medicine | Admitting: Emergency Medicine

## 2023-12-06 ENCOUNTER — Encounter (HOSPITAL_COMMUNITY): Payer: Self-pay

## 2023-12-06 VITALS — BP 147/93 | HR 85 | Temp 98.3°F | Resp 18

## 2023-12-06 DIAGNOSIS — Z202 Contact with and (suspected) exposure to infections with a predominantly sexual mode of transmission: Secondary | ICD-10-CM | POA: Diagnosis present

## 2023-12-06 MED ORDER — METRONIDAZOLE 500 MG PO TABS: 500.0000 mg | ORAL_TABLET | Freq: Two times a day (BID) | ORAL | 0 refills | Status: AC

## 2023-12-06 NOTE — Discharge Instructions (Signed)
We will call you if anything on your swab returns positive. You can also see these results on MyChart. Please abstain from sexual intercourse until your results return.  In the meantime I am treating you for trichomonas. Please take medication as prescribed, always with food. Do not drink alcohol while taking!

## 2023-12-06 NOTE — ED Triage Notes (Signed)
Pt states has had exposure to Trich from her ex and her sons father. Denies sx's.

## 2023-12-06 NOTE — ED Provider Notes (Signed)
[facility]    CSN: [id number] Arrival date & time: 12/06/23  1034      History   Chief Complaint Chief Complaint  Patient presents with   Exposure to STD    HPI Deborah Sawyer is a 38 y.o. female.  Reports exposure to trichomonas Not having symptoms at this time. No discharge, odor, itching, dysuria, abd pain LMP 1/30  Past Medical History:  Diagnosis Date   Anemia    during 1st pregnancy   Hernia of abdominal cavity age 60   Hx of lumpectomy age 7    Patient Active Problem List   Diagnosis Date Noted   Ankle syndesmosis disruption, left, initial encounter 06/24/2018   Bimalleolar ankle fracture, left, closed, initial encounter 06/11/2018   Trichomoniasis 08/20/2017    Past Surgical History:  Procedure Laterality Date   BREAST LUMPECTOMY Left    benign, at age 51   HERNIA REPAIR     ORIF ANKLE FRACTURE Left 06/14/2018   Procedure: OPEN REDUCTION INTERNAL FIXATION (ORIF) ANKLE FRACTURE;  Surgeon: Roby Lofts, MD;  Location: [facility];  Service: Orthopedics;  Laterality: Left;    OB History     Gravida  2   Para  2   Term  2   Preterm      AB      Living  2      SAB      IAB      Ectopic      Multiple  0   Live Births  2            Home Medications    Prior to Admission medications   Medication Sig Start Date End Date Taking? Authorizing Provider  metroNIDAZOLE (FLAGYL) 500 MG tablet Take 1 tablet (500 mg total) by mouth 2 (two) times daily for 7 days. 12/06/23 12/13/23 Yes Kelcy Laible, Ray Church    Family History History reviewed. No pertinent family history.  Social History Social History   Tobacco Use   Smoking status: Former    Current packs/day: 0.00    Types: Cigarettes    Quit date: 06/23/2017    Years since quitting: 6.4   Smokeless tobacco: Never  Vaping Use   Vaping status: Never Used  Substance Use Topics   Alcohol use: No   Drug use: No     Allergies   Patient has no known  allergies.   Review of Systems Review of Systems Per HPI  Physical Exam Triage Vital Signs ED Triage Vitals [12/06/23 1107]  Encounter Vitals Group     BP (!) 147/93     Systolic BP Percentile      Diastolic BP Percentile      Pulse Rate 85     Resp 18     Temp 98.3 F (36.8 C)     Temp Source Oral     SpO2 97 %     Weight      Height      Head Circumference      Peak Flow      Pain Score 0     Pain Loc      Pain Education      Exclude from Growth Chart    No data found.  Updated Vital Signs BP (!) 147/93 (BP Location: Right Arm)   Pulse 85   Temp 98.3 F (36.8 C) (Oral)   Resp 18   LMP 11/22/2023 (Approximate)   SpO2 97%   Breastfeeding No  Visual Acuity Right Eye Distance:   Left Eye Distance:   Bilateral Distance:    Right Eye Near:   Left Eye Near:    Bilateral Near:     Physical Exam Vitals and nursing note reviewed.  Constitutional:      General: She is not in acute distress.    Appearance: Normal appearance.  Cardiovascular:     Rate and Rhythm: Normal rate and regular rhythm.     Heart sounds: Normal heart sounds.  Pulmonary:     Effort: Pulmonary effort is normal.     Breath sounds: Normal breath sounds.  Neurological:     Mental Status: She is alert and oriented to person, place, and time.      UC Treatments / Results  Labs (all labs ordered are listed, but only abnormal results are displayed) Labs Reviewed  CERVICOVAGINAL ANCILLARY ONLY    EKG   Radiology No results found.  Procedures Procedures (including critical care time)  Medications Ordered in UC Medications - No data to display  Initial Impression / Assessment and Plan / UC Course  I have reviewed the triage vital signs and the nursing notes.  Pertinent labs & imaging results that were available during my care of the patient were reviewed by me and considered in my medical decision making (see chart for details).  Cyto swab pending With trichomonas  exposure, treat with Flagyl twice daily for 7 days.  Discussed side effects. No questions at this time  Final Clinical Impressions(s) / UC Diagnoses   Final diagnoses:  Trichomonas exposure     Discharge Instructions      We will call you if anything on your swab returns positive. You can also see these results on MyChart. Please abstain from sexual intercourse until your results return.  In the meantime I am treating you for trichomonas. Please take medication as prescribed, always with food. Do not drink alcohol while taking!      ED Prescriptions     Medication Sig Dispense Auth. Provider   metroNIDAZOLE (FLAGYL) 500 MG tablet Take 1 tablet (500 mg total) by mouth 2 (two) times daily for 7 days. 14 tablet Maeghan Canny, Lurena Joiner, PA-C      PDMP not reviewed this encounter.   Marlow Baars, [place] 12/06/23 1141

## 2023-12-07 LAB — CERVICOVAGINAL ANCILLARY ONLY
Chlamydia: NEGATIVE
Comment: NEGATIVE
Comment: NEGATIVE
Comment: NORMAL
Neisseria Gonorrhea: POSITIVE — AB
Trichomonas: POSITIVE — AB

## 2023-12-10 ENCOUNTER — Telehealth (HOSPITAL_COMMUNITY): Payer: Self-pay

## 2023-12-10 NOTE — Telephone Encounter (Signed)
 Per protocol, pt will need to return to UC for Nurse Visit for Rocephin 500mg  IM.  Reviewed with patient; verbalized understanding.

## 2023-12-12 ENCOUNTER — Ambulatory Visit (HOSPITAL_COMMUNITY): Payer: Medicaid Other

## 2023-12-18 ENCOUNTER — Ambulatory Visit (HOSPITAL_COMMUNITY): Payer: Self-pay

## 2023-12-20 ENCOUNTER — Ambulatory Visit (HOSPITAL_COMMUNITY): Payer: Medicaid Other

## 2023-12-21 ENCOUNTER — Ambulatory Visit (HOSPITAL_COMMUNITY): Payer: Medicaid Other

## 2024-01-03 ENCOUNTER — Ambulatory Visit (HOSPITAL_COMMUNITY)

## 2024-01-07 ENCOUNTER — Ambulatory Visit (HOSPITAL_COMMUNITY)

## 2024-01-08 ENCOUNTER — Ambulatory Visit (HOSPITAL_COMMUNITY)

## 2024-01-09 ENCOUNTER — Ambulatory Visit (HOSPITAL_COMMUNITY)

## 2024-01-10 ENCOUNTER — Encounter (HOSPITAL_COMMUNITY): Payer: Self-pay

## 2024-01-10 ENCOUNTER — Ambulatory Visit (HOSPITAL_COMMUNITY)
Admission: RE | Admit: 2024-01-10 | Discharge: 2024-01-10 | Disposition: A | Discharge status: Home / Self Care | Source: Ambulatory Visit | Attending: Emergency Medicine | Admitting: Emergency Medicine

## 2024-01-10 VITALS — BP 113/74 | HR 74 | Temp 98.2°F | Resp 18 | Ht 62.0 in | Wt 125.0 lb

## 2024-01-10 DIAGNOSIS — R051 Acute cough: Secondary | ICD-10-CM

## 2024-01-10 DIAGNOSIS — A549 Gonococcal infection, unspecified: Secondary | ICD-10-CM

## 2024-01-10 MED ORDER — CEFTRIAXONE SODIUM 500 MG IJ SOLR
500.0000 mg | INTRAMUSCULAR | Status: DC
  Administered 2024-01-10: 500 mg via INTRAMUSCULAR

## 2024-01-10 MED ORDER — PROMETHAZINE-DM 6.25-15 MG/5ML PO SYRP: 5.0000 mL | ORAL_SOLUTION | Freq: Every evening | ORAL | 0 refills | Status: AC | PRN

## 2024-01-10 MED ORDER — BENZONATATE 100 MG PO CAPS: 100.0000 mg | ORAL_CAPSULE | Freq: Three times a day (TID) | ORAL | 0 refills | Status: AC

## 2024-01-10 MED ORDER — CEFTRIAXONE SODIUM 500 MG IJ SOLR
INTRAMUSCULAR | Status: AC
  Filled 2024-01-10: qty 500

## 2024-01-10 MED ORDER — LIDOCAINE HCL (PF) 1 % IJ SOLN
INTRAMUSCULAR | Status: AC
  Filled 2024-01-10: qty 2

## 2024-01-10 NOTE — Discharge Instructions (Signed)
 You received an injection of Rocephin today for treatment of gonorrhea. I have prescribed Tessalon that you can take every 8 hours as needed for cough.   I have also prescribed Promethazine DM cough syrup that you can take at night for cough.  This can make you drowsy so do not work, drive, or drink alcohol while taking this.   You can also take Mucinex as needed for cough and congestion. Return here for symptoms persist or worsen.

## 2024-01-10 NOTE — ED Provider Notes (Signed)
 [facility]    CSN: [id number] Arrival date & time: 01/10/24  1043      History   Chief Complaint Chief Complaint  Patient presents with   Appointment   SEXUALLY TRANSMITTED DISEASE   Cough    HPI Deborah Sawyer is a 38 y.o. female.   Patient initially presented for treatment for recent testing positive for gonorrhea.  Patient endorses dry cough that began on 3/11.  Endorses some mild nasal congestion as well.  Denies fever, body aches, chills, headache, chest pain, shortness of breath.  Denies taking any medications for her symptoms, but did try taking honey with minimal relief.   Cough   Past Medical History:  Diagnosis Date   Anemia    during 1st pregnancy   Hernia of abdominal cavity age 79   Hx of lumpectomy age 31    Patient Active Problem List   Diagnosis Date Noted   Ankle syndesmosis disruption, left, initial encounter 06/24/2018   Bimalleolar ankle fracture, left, closed, initial encounter 06/11/2018   Trichomoniasis 08/20/2017    Past Surgical History:  Procedure Laterality Date   BREAST LUMPECTOMY Left    benign, at age 23   HERNIA REPAIR     ORIF ANKLE FRACTURE Left 06/14/2018   Procedure: OPEN REDUCTION INTERNAL FIXATION (ORIF) ANKLE FRACTURE;  Surgeon: Roby Lofts, MD;  Location: [facility];  Service: Orthopedics;  Laterality: Left;    OB History     Gravida  2   Para  2   Term  2   Preterm      AB      Living  2      SAB      IAB      Ectopic      Multiple  0   Live Births  2            Home Medications    Prior to Admission medications   Medication Sig Start Date End Date Taking? Authorizing Provider  benzonatate (TESSALON) 100 MG capsule Take 1 capsule (100 mg total) by mouth every 8 (eight) hours. 01/10/24  Yes Wynonia Lawman A, NP  promethazine-dextromethorphan (PROMETHAZINE-DM) 6.25-15 MG/5ML syrup Take 5 mLs by mouth at bedtime as needed for cough. 01/10/24  Yes Letta Kocher, NP     Family History History reviewed. No pertinent family history.  Social History Social History   Tobacco Use   Smoking status: Former    Current packs/day: 0.00    Types: Cigarettes    Quit date: 06/23/2017    Years since quitting: 6.5   Smokeless tobacco: Never  Vaping Use   Vaping status: Never Used  Substance Use Topics   Alcohol use: No   Drug use: No     Allergies   Patient has no known allergies.   Review of Systems Review of Systems  Respiratory:  Positive for cough.    Per HPI  Physical Exam Triage Vital Signs ED Triage Vitals  Encounter Vitals Group     BP 01/10/24 1109 113/74     Systolic BP Percentile --      Diastolic BP Percentile --      Pulse Rate 01/10/24 1109 74     Resp 01/10/24 1109 18     Temp 01/10/24 1109 98.2 F (36.8 C)     Temp Source 01/10/24 1109 Oral     SpO2 01/10/24 1109 95 %     Weight 01/10/24 1108 125 lb (56.7 kg)  Height 01/10/24 1108 5\' 2"  (1.575 m)     Head Circumference --      Peak Flow --      Pain Score 01/10/24 1107 0     Pain Loc --      Pain Education --      Exclude from Growth Chart --    No data found.  Updated Vital Signs BP 113/74 (BP Location: Right Arm)   Pulse 74   Temp 98.2 F (36.8 C) (Oral)   Resp 18   Ht 5\' 2"  (1.575 m)   Wt 125 lb (56.7 kg)   LMP 12/20/2023 (Approximate)   SpO2 95%   BMI 22.86 kg/m   Visual Acuity Right Eye Distance:   Left Eye Distance:   Bilateral Distance:    Right Eye Near:   Left Eye Near:    Bilateral Near:     Physical Exam Vitals and nursing note reviewed.  Constitutional:      General: She is awake. She is not in acute distress.    Appearance: Normal appearance. She is well-developed and well-groomed. She is not ill-appearing.  HENT:     Right Ear: Tympanic membrane, ear canal and external ear normal.     Left Ear: Tympanic membrane, ear canal and external ear normal.     Nose: Congestion present. No rhinorrhea.     Mouth/Throat:     Mouth:  Mucous membranes are moist.     Pharynx: Oropharynx is clear.  Cardiovascular:     Rate and Rhythm: Normal rate and regular rhythm.  Pulmonary:     Effort: Pulmonary effort is normal.     Breath sounds: Normal breath sounds.  Skin:    General: Skin is warm and dry.  Neurological:     Mental Status: She is alert.  Psychiatric:        Behavior: Behavior is cooperative.      UC Treatments / Results  Labs (all labs ordered are listed, but only abnormal results are displayed) Labs Reviewed - No data to display  EKG   Radiology No results found.  Procedures Procedures (including critical care time)  Medications Ordered in UC Medications - No data to display  Initial Impression / Assessment and Plan / UC Course  I have reviewed the triage vital signs and the nursing notes.  Pertinent labs & imaging results that were available during my care of the patient were reviewed by me and considered in my medical decision making (see chart for details).     Upon assessment mild congestion is present.  No other significant findings on exam.  Ordered Rocephin for gonorrhea treatment.  Prescribed Tessalon and Promethazine DM as needed for cough.  Discussed over-the-counter medication for cough as well.  Discussed return precautions. Final Clinical Impressions(s) / UC Diagnoses   Final diagnoses:  Acute cough  Gonorrhea     Discharge Instructions      You received an injection of Rocephin today for treatment of gonorrhea. I have prescribed Tessalon that you can take every 8 hours as needed for cough.   I have also prescribed Promethazine DM cough syrup that you can take at night for cough.  This can make you drowsy so do not work, drive, or drink alcohol while taking this.   You can also take Mucinex as needed for cough and congestion. Return here for symptoms persist or worsen.   ED Prescriptions     Medication Sig Dispense Auth. Provider   benzonatate (TESSALON) 100  MG  capsule Take 1 capsule (100 mg total) by mouth every 8 (eight) hours. 21 capsule Wynonia Lawman A, NP   promethazine-dextromethorphan (PROMETHAZINE-DM) 6.25-15 MG/5ML syrup Take 5 mLs by mouth at bedtime as needed for cough. 118 mL Wynonia Lawman A, NP      PDMP not reviewed this encounter.   Wynonia Lawman A, NP 01/10/24 [phone]

## 2024-01-10 NOTE — ED Triage Notes (Signed)
"  Also have cough need to get evaluated. Need to get evaluation for job. And need treatment shot for gonorrhea." - Entered by patient.  Chief Complaint: Patient does have a history of smoking. Hit her cigarette last Tuesday night and having a dry cough since. States when waking up having 15 min dry cough spell. Slight nasal congestion as well.   Patient also tested positive for [facility]hea [facility] at the [facility]. Needing treatment.   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   Onset: cough since last Tuesday night,   Prescriptions or OTC medications tried: Yes- honey,     with little relief  New foods, medications, or products: No  Recent Travel: No

## 2024-03-01 ENCOUNTER — Ambulatory Visit
